# Patient Record
Sex: Male | Born: 1970 | Race: White | Hispanic: No | Marital: Single | Smoking: Current every day smoker
Health system: Southern US, Community
[De-identification: ages and names within clinical notes are randomized; demographics above are authoritative.]

---

## 2017-08-27 ENCOUNTER — Other Ambulatory Visit: Payer: Self-pay

## 2017-08-27 ENCOUNTER — Emergency Department (HOSPITAL_COMMUNITY): Payer: No Typology Code available for payment source

## 2017-08-27 ENCOUNTER — Encounter (HOSPITAL_COMMUNITY): Admission: EM | Disposition: A | Discharge status: Skilled Nursing Facility | Payer: Self-pay | Source: Home / Self Care

## 2017-08-27 ENCOUNTER — Encounter (HOSPITAL_COMMUNITY): Payer: Self-pay | Admitting: Surgery

## 2017-08-27 ENCOUNTER — Inpatient Hospital Stay (HOSPITAL_COMMUNITY)
Admission: EM | Admit: 2017-08-27 | Discharge: 2017-09-13 | DRG: 956 | Disposition: A | Discharge status: Skilled Nursing Facility | Payer: No Typology Code available for payment source | Attending: General Surgery | Admitting: General Surgery

## 2017-08-27 ENCOUNTER — Emergency Department (HOSPITAL_COMMUNITY): Payer: No Typology Code available for payment source | Admitting: Anesthesiology

## 2017-08-27 DIAGNOSIS — K5900 Constipation, unspecified: Secondary | ICD-10-CM | POA: Diagnosis not present

## 2017-08-27 DIAGNOSIS — S32592A Other specified fracture of left pubis, initial encounter for closed fracture: Secondary | ICD-10-CM | POA: Diagnosis present

## 2017-08-27 DIAGNOSIS — S01111A Laceration without foreign body of right eyelid and periocular area, initial encounter: Secondary | ICD-10-CM | POA: Diagnosis present

## 2017-08-27 DIAGNOSIS — S299XXA Unspecified injury of thorax, initial encounter: Secondary | ICD-10-CM

## 2017-08-27 DIAGNOSIS — R402353 Coma scale, best motor response, localizes pain, at hospital admission: Secondary | ICD-10-CM | POA: Diagnosis present

## 2017-08-27 DIAGNOSIS — S0181XA Laceration without foreign body of other part of head, initial encounter: Secondary | ICD-10-CM | POA: Diagnosis present

## 2017-08-27 DIAGNOSIS — S066X9A Traumatic subarachnoid hemorrhage with loss of consciousness of unspecified duration, initial encounter: Secondary | ICD-10-CM | POA: Diagnosis present

## 2017-08-27 DIAGNOSIS — S42202A Unspecified fracture of upper end of left humerus, initial encounter for closed fracture: Secondary | ICD-10-CM | POA: Diagnosis present

## 2017-08-27 DIAGNOSIS — Z419 Encounter for procedure for purposes other than remedying health state, unspecified: Secondary | ICD-10-CM

## 2017-08-27 DIAGNOSIS — R609 Edema, unspecified: Secondary | ICD-10-CM

## 2017-08-27 DIAGNOSIS — S3609XA Other injury of spleen, initial encounter: Secondary | ICD-10-CM | POA: Diagnosis present

## 2017-08-27 DIAGNOSIS — S0232XA Fracture of orbital floor, left side, initial encounter for closed fracture: Secondary | ICD-10-CM | POA: Diagnosis present

## 2017-08-27 DIAGNOSIS — F17210 Nicotine dependence, cigarettes, uncomplicated: Secondary | ICD-10-CM | POA: Diagnosis present

## 2017-08-27 DIAGNOSIS — R402133 Coma scale, eyes open, to sound, at hospital admission: Secondary | ICD-10-CM | POA: Diagnosis present

## 2017-08-27 DIAGNOSIS — S0633AA Contusion and laceration of cerebrum, unspecified, with loss of consciousness status unknown, initial encounter: Secondary | ICD-10-CM

## 2017-08-27 DIAGNOSIS — S022XXA Fracture of nasal bones, initial encounter for closed fracture: Secondary | ICD-10-CM | POA: Diagnosis present

## 2017-08-27 DIAGNOSIS — S0240FA Zygomatic fracture, left side, initial encounter for closed fracture: Secondary | ICD-10-CM | POA: Diagnosis present

## 2017-08-27 DIAGNOSIS — E870 Hyperosmolality and hypernatremia: Secondary | ICD-10-CM | POA: Diagnosis not present

## 2017-08-27 DIAGNOSIS — J9811 Atelectasis: Secondary | ICD-10-CM | POA: Diagnosis present

## 2017-08-27 DIAGNOSIS — T148XXA Other injury of unspecified body region, initial encounter: Secondary | ICD-10-CM

## 2017-08-27 DIAGNOSIS — I959 Hypotension, unspecified: Secondary | ICD-10-CM | POA: Diagnosis present

## 2017-08-27 DIAGNOSIS — S72142A Displaced intertrochanteric fracture of left femur, initial encounter for closed fracture: Secondary | ICD-10-CM

## 2017-08-27 DIAGNOSIS — S81811A Laceration without foreign body, right lower leg, initial encounter: Secondary | ICD-10-CM | POA: Diagnosis present

## 2017-08-27 DIAGNOSIS — K661 Hemoperitoneum: Secondary | ICD-10-CM | POA: Diagnosis present

## 2017-08-27 DIAGNOSIS — S06339A Contusion and laceration of cerebrum, unspecified, with loss of consciousness of unspecified duration, initial encounter: Secondary | ICD-10-CM

## 2017-08-27 DIAGNOSIS — S32602A Unspecified fracture of left ischium, initial encounter for closed fracture: Secondary | ICD-10-CM | POA: Diagnosis present

## 2017-08-27 DIAGNOSIS — R402243 Coma scale, best verbal response, confused conversation, at hospital admission: Secondary | ICD-10-CM | POA: Diagnosis present

## 2017-08-27 DIAGNOSIS — D62 Acute posthemorrhagic anemia: Secondary | ICD-10-CM | POA: Diagnosis present

## 2017-08-27 DIAGNOSIS — S7290XA Unspecified fracture of unspecified femur, initial encounter for closed fracture: Secondary | ICD-10-CM

## 2017-08-27 DIAGNOSIS — J9691 Respiratory failure, unspecified with hypoxia: Secondary | ICD-10-CM | POA: Diagnosis present

## 2017-08-27 DIAGNOSIS — J189 Pneumonia, unspecified organism: Secondary | ICD-10-CM

## 2017-08-27 DIAGNOSIS — T1490XA Injury, unspecified, initial encounter: Secondary | ICD-10-CM

## 2017-08-27 DIAGNOSIS — J9690 Respiratory failure, unspecified, unspecified whether with hypoxia or hypercapnia: Secondary | ICD-10-CM

## 2017-08-27 HISTORY — PX: LAPAROTOMY: SHX154

## 2017-08-27 HISTORY — PX: INSERTION OF TRACTION PIN: SHX6560

## 2017-08-27 HISTORY — PX: FACIAL LACERATION REPAIR: SHX6589

## 2017-08-27 LAB — COMPREHENSIVE METABOLIC PANEL
ALT: 38 U/L (ref 17–63)
AST: 76 U/L — ABNORMAL HIGH (ref 15–41)
Albumin: 3.2 g/dL — ABNORMAL LOW (ref 3.5–5.0)
Alkaline Phosphatase: 63 U/L (ref 38–126)
Anion gap: 13 (ref 5–15)
BUN: 11 mg/dL (ref 6–20)
CO2: 21 mmol/L — ABNORMAL LOW (ref 22–32)
Calcium: 8.2 mg/dL — ABNORMAL LOW (ref 8.9–10.3)
Chloride: 100 mmol/L — ABNORMAL LOW (ref 101–111)
Creatinine, Ser: 1.23 mg/dL (ref 0.61–1.24)
GFR calc Af Amer: >60 mL/min (ref >60)
GFR calc non Af Amer: >60 mL/min (ref >60)
Glucose, Bld: 146 mg/dL — ABNORMAL HIGH (ref 65–99)
Potassium: 3.8 mmol/L (ref 3.5–5.1)
Sodium: 134 mmol/L — ABNORMAL LOW (ref 135–145)
Total Bilirubin: 0.9 mg/dL (ref 0.3–1.2)
Total Protein: 6.2 g/dL — ABNORMAL LOW (ref 6.5–8.1)

## 2017-08-27 LAB — I-STAT CHEM 8, ED
BUN: 13 mg/dL (ref 6–20)
Calcium, Ion: 1.04 mmol/L — ABNORMAL LOW (ref 1.15–1.40)
Chloride: 99 mmol/L — ABNORMAL LOW (ref 101–111)
Creatinine, Ser: 1 mg/dL (ref 0.61–1.24)
Glucose, Bld: 147 mg/dL — ABNORMAL HIGH (ref 65–99)
HCT: 40 % (ref 39.0–52.0)
Hemoglobin: 13.6 g/dL (ref 13.0–17.0)
Potassium: 3.6 mmol/L (ref 3.5–5.1)
Sodium: 135 mmol/L (ref 135–145)
TCO2: 23 mmol/L (ref 22–32)

## 2017-08-27 LAB — I-STAT ARTERIAL BLOOD GAS, ED
Acid-base deficit: 3 mmol/L — ABNORMAL HIGH (ref 0.0–2.0)
Bicarbonate: 24.1 mmol/L (ref 20.0–28.0)
O2 Saturation: 100 %
TCO2: 26 mmol/L (ref 22–32)
pCO2 arterial: 50.7 mmHg — ABNORMAL HIGH (ref 32.0–48.0)
pH, Arterial: 7.286 — ABNORMAL LOW (ref 7.350–7.450)
pO2, Arterial: 291 mmHg — ABNORMAL HIGH (ref 83.0–108.0)

## 2017-08-27 LAB — CDS SEROLOGY

## 2017-08-27 LAB — PROTIME-INR
INR: 1.34
Prothrombin Time: 16.5 seconds — ABNORMAL HIGH (ref 11.4–15.2)

## 2017-08-27 LAB — CBC
HCT: 38.8 % — ABNORMAL LOW (ref 39.0–52.0)
Hemoglobin: 12.9 g/dL — ABNORMAL LOW (ref 13.0–17.0)
MCH: 31.2 pg (ref 26.0–34.0)
MCHC: 33.2 g/dL (ref 30.0–36.0)
MCV: 93.7 fL (ref 78.0–100.0)
Platelets: 244 10*3/uL (ref 150–400)
RBC: 4.14 MIL/uL — ABNORMAL LOW (ref 4.22–5.81)
RDW: 13 % (ref 11.5–15.5)
WBC: 17.9 10*3/uL — ABNORMAL HIGH (ref 4.0–10.5)

## 2017-08-27 LAB — I-STAT CG4 LACTIC ACID, ED: Lactic Acid, Venous: 7.16 mmol/L (ref 0.5–1.9)

## 2017-08-27 LAB — PREPARE RBC (CROSSMATCH)

## 2017-08-27 LAB — ETHANOL: Alcohol, Ethyl (B): <10 mg/dL (ref <10)

## 2017-08-27 LAB — ABO/RH: ABO/RH(D): A POS

## 2017-08-27 SURGERY — LAPAROTOMY, EXPLORATORY
Anesthesia: General | Site: Leg Lower | Laterality: Right

## 2017-08-27 MED ORDER — PROPOFOL 1000 MG/100ML IV EMUL
0.0000 ug/kg/min | INTRAVENOUS | Status: DC
  Administered 2017-08-27: 25 ug/kg/min via INTRAVENOUS
  Administered 2017-08-28: 15 ug/kg/min via INTRAVENOUS
  Administered 2017-08-28 (×2): 20 ug/kg/min via INTRAVENOUS
  Administered 2017-08-28: 30 ug/kg/min via INTRAVENOUS
  Administered 2017-08-29: 25 ug/kg/min via INTRAVENOUS
  Administered 2017-08-29: 35 ug/kg/min via INTRAVENOUS
  Administered 2017-08-29 – 2017-08-30 (×2): 30 ug/kg/min via INTRAVENOUS
  Administered 2017-08-30: 40 ug/kg/min via INTRAVENOUS
  Administered 2017-08-30 (×2): 25 ug/kg/min via INTRAVENOUS
  Administered 2017-08-30 – 2017-08-31 (×3): 30 ug/kg/min via INTRAVENOUS
  Filled 2017-08-27 (×14): qty 100

## 2017-08-27 MED ORDER — DOCUSATE SODIUM 50 MG/5ML PO LIQD
100.0000 mg | Freq: Two times a day (BID) | ORAL | Status: DC | PRN
  Administered 2017-08-29 – 2017-08-31 (×4): 100 mg
  Filled 2017-08-27 (×4): qty 10

## 2017-08-27 MED ORDER — SODIUM CHLORIDE 0.9 % IV SOLN
INTRAVENOUS | Status: DC
  Administered 2017-08-28 – 2017-08-31 (×6): via INTRAVENOUS

## 2017-08-27 MED ORDER — 0.9 % SODIUM CHLORIDE (POUR BTL) OPTIME
TOPICAL | Status: DC | PRN
  Administered 2017-08-27: 3000 mL

## 2017-08-27 MED ORDER — HYDRALAZINE HCL 20 MG/ML IJ SOLN: 10.0000 mg | INTRAMUSCULAR | Status: DC | PRN

## 2017-08-27 MED ORDER — MIDAZOLAM HCL 2 MG/2ML IJ SOLN
INTRAMUSCULAR | Status: AC
  Filled 2017-08-27: qty 2

## 2017-08-27 MED ORDER — ROCURONIUM BROMIDE 50 MG/5ML IV SOLN
INTRAVENOUS | Status: AC | PRN
  Administered 2017-08-27: 100 mg via INTRAVENOUS

## 2017-08-27 MED ORDER — FENTANYL CITRATE (PF) 100 MCG/2ML IJ SOLN: 50.0000 ug | Freq: Once | INTRAMUSCULAR | Status: DC

## 2017-08-27 MED ORDER — FENTANYL CITRATE (PF) 250 MCG/5ML IJ SOLN
INTRAMUSCULAR | Status: DC | PRN
  Administered 2017-08-27 (×2): 100 ug via INTRAVENOUS
  Administered 2017-08-27: 50 ug via INTRAVENOUS

## 2017-08-27 MED ORDER — TETANUS-DIPHTH-ACELL PERTUSSIS 5-2.5-18.5 LF-MCG/0.5 IM SUSP
0.5000 mL | Freq: Once | INTRAMUSCULAR | Status: AC
  Administered 2017-09-04: 0.5 mL via INTRAMUSCULAR
  Filled 2017-08-27: qty 0.5

## 2017-08-27 MED ORDER — MIDAZOLAM HCL 2 MG/2ML IJ SOLN
2.0000 mg | INTRAMUSCULAR | Status: DC | PRN
  Administered 2017-09-03: 2 mg via INTRAVENOUS
  Filled 2017-08-27: qty 2

## 2017-08-27 MED ORDER — PHENYLEPHRINE HCL 10 MG/ML IJ SOLN
INTRAMUSCULAR | Status: DC | PRN
  Administered 2017-08-27 (×2): 80 ug via INTRAVENOUS

## 2017-08-27 MED ORDER — MIDAZOLAM HCL 2 MG/2ML IJ SOLN
INTRAMUSCULAR | Status: DC | PRN
  Administered 2017-08-27: 2 mg via INTRAVENOUS

## 2017-08-27 MED ORDER — ETOMIDATE 2 MG/ML IV SOLN
INTRAVENOUS | Status: AC | PRN
  Administered 2017-08-27: 30 mg via INTRAVENOUS

## 2017-08-27 MED ORDER — PROPOFOL 1000 MG/100ML IV EMUL
INTRAVENOUS | Status: AC | PRN
  Administered 2017-08-27: 10 ug/kg/min via INTRAVENOUS

## 2017-08-27 MED ORDER — PROPOFOL 10 MG/ML IV BOLUS
INTRAVENOUS | Status: AC
  Filled 2017-08-27: qty 20

## 2017-08-27 MED ORDER — FENTANYL CITRATE (PF) 100 MCG/2ML IJ SOLN
INTRAMUSCULAR | Status: AC | PRN
  Administered 2017-08-27: 100 ug via INTRAVENOUS

## 2017-08-27 MED ORDER — FENTANYL CITRATE (PF) 250 MCG/5ML IJ SOLN
INTRAMUSCULAR | Status: AC
  Filled 2017-08-27: qty 5

## 2017-08-27 MED ORDER — PROPOFOL 500 MG/50ML IV EMUL
INTRAVENOUS | Status: DC | PRN
  Administered 2017-08-27: 25 ug/kg/min via INTRAVENOUS

## 2017-08-27 MED ORDER — ROCURONIUM BROMIDE 100 MG/10ML IV SOLN
INTRAVENOUS | Status: DC | PRN
  Administered 2017-08-27 (×2): 50 mg via INTRAVENOUS

## 2017-08-27 MED ORDER — ROCURONIUM BROMIDE 50 MG/5ML IV SOLN
INTRAVENOUS | Status: AC
  Filled 2017-08-27: qty 2

## 2017-08-27 MED ORDER — CEFAZOLIN SODIUM-DEXTROSE 2-3 GM-%(50ML) IV SOLR
INTRAVENOUS | Status: DC | PRN
  Administered 2017-08-27: 2 g via INTRAVENOUS

## 2017-08-27 MED ORDER — CEFAZOLIN SODIUM 1 G IJ SOLR
INTRAMUSCULAR | Status: AC
  Filled 2017-08-27: qty 20

## 2017-08-27 MED ORDER — FENTANYL 2500MCG IN NS 250ML (10MCG/ML) PREMIX INFUSION
25.0000 ug/h | INTRAVENOUS | Status: DC
  Administered 2017-08-27: 50 ug/h via INTRAVENOUS
  Administered 2017-08-28: 150 ug/h via INTRAVENOUS
  Administered 2017-08-28: 250 ug/h via INTRAVENOUS
  Administered 2017-08-29: 150 ug/h via INTRAVENOUS
  Administered 2017-08-29 – 2017-08-30 (×2): 250 ug/h via INTRAVENOUS
  Administered 2017-08-30: 200 ug/h via INTRAVENOUS
  Administered 2017-08-31: 250 ug/h via INTRAVENOUS
  Administered 2017-08-31: 100 ug/h via INTRAVENOUS
  Administered 2017-09-01: 50 ug/h via INTRAVENOUS
  Filled 2017-08-27 (×10): qty 250

## 2017-08-27 MED ORDER — ORAL CARE MOUTH RINSE
15.0000 mL | Freq: Four times a day (QID) | OROMUCOSAL | Status: DC
  Administered 2017-08-28: 15 mL via OROMUCOSAL

## 2017-08-27 MED ORDER — CHLORHEXIDINE GLUCONATE 0.12% ORAL RINSE (MEDLINE KIT)
15.0000 mL | Freq: Two times a day (BID) | OROMUCOSAL | Status: DC
  Administered 2017-08-28: 15 mL via OROMUCOSAL

## 2017-08-27 MED ORDER — FENTANYL BOLUS VIA INFUSION
50.0000 ug | INTRAVENOUS | Status: DC | PRN
  Administered 2017-08-28 – 2017-09-01 (×4): 50 ug via INTRAVENOUS
  Filled 2017-08-27: qty 50

## 2017-08-27 MED ORDER — LACTATED RINGERS IV SOLN
INTRAVENOUS | Status: DC | PRN
  Administered 2017-08-27: 21:00:00 via INTRAVENOUS

## 2017-08-27 MED ORDER — SODIUM CHLORIDE 0.9 % IV SOLN
INTRAVENOUS | Status: DC | PRN
  Administered 2017-08-27: 21:00:00 via INTRAVENOUS

## 2017-08-27 MED ORDER — SODIUM CHLORIDE 0.9 % IV SOLN
Freq: Once | INTRAVENOUS | Status: AC
  Administered 2017-08-27: via INTRAVENOUS

## 2017-08-27 MED ORDER — IOPAMIDOL (ISOVUE-300) INJECTION 61%
100.0000 mL | Freq: Once | INTRAVENOUS | Status: AC | PRN
  Administered 2017-08-27: 100 mL via INTRAVENOUS

## 2017-08-27 MED ORDER — FAMOTIDINE IN NACL 20-0.9 MG/50ML-% IV SOLN
20.0000 mg | Freq: Two times a day (BID) | INTRAVENOUS | Status: DC
  Administered 2017-08-28 – 2017-09-04 (×15): 20 mg via INTRAVENOUS
  Filled 2017-08-27 (×15): qty 50

## 2017-08-27 MED ORDER — MIDAZOLAM HCL 2 MG/2ML IJ SOLN
2.0000 mg | INTRAMUSCULAR | Status: DC | PRN
  Administered 2017-08-29 – 2017-09-04 (×8): 2 mg via INTRAVENOUS
  Filled 2017-08-27 (×9): qty 2

## 2017-08-27 MED ORDER — PROPOFOL 1000 MG/100ML IV EMUL
INTRAVENOUS | Status: AC
  Filled 2017-08-27: qty 100

## 2017-08-27 SURGICAL SUPPLY — 50 items
BLADE CLIPPER SURG (BLADE) IMPLANT
BNDG GAUZE ELAST 4 BULKY (GAUZE/BANDAGES/DRESSINGS) ×4 IMPLANT
CANISTER SUCT 3000ML PPV (MISCELLANEOUS) ×8 IMPLANT
CANISTER WOUNDNEG PRESSURE 500 (CANNISTER) ×8 IMPLANT
CHLORAPREP W/TINT 26ML (MISCELLANEOUS) IMPLANT
COVER SURGICAL LIGHT HANDLE (MISCELLANEOUS) ×4 IMPLANT
DRAPE LAPAROSCOPIC ABDOMINAL (DRAPES) ×4 IMPLANT
DRAPE WARM FLUID 44X44 (DRAPE) ×4 IMPLANT
DRSG OPSITE POSTOP 4X10 (GAUZE/BANDAGES/DRESSINGS) IMPLANT
DRSG OPSITE POSTOP 4X8 (GAUZE/BANDAGES/DRESSINGS) IMPLANT
DURAPREP 26ML APPLICATOR (WOUND CARE) ×4 IMPLANT
ELECT BLADE 6.5 EXT (BLADE) ×4 IMPLANT
ELECT CAUTERY BLADE 6.4 (BLADE) ×4 IMPLANT
ELECT REM PT RETURN 9FT ADLT (ELECTROSURGICAL) ×4
ELECTRODE REM PT RTRN 9FT ADLT (ELECTROSURGICAL) ×3 IMPLANT
GAUZE SPONGE 4X4 16PLY XRAY LF (GAUZE/BANDAGES/DRESSINGS) ×4 IMPLANT
GLOVE BIO SURGEON STRL SZ7 (GLOVE) ×4 IMPLANT
GLOVE BIOGEL PI IND STRL 7.0 (GLOVE) ×9 IMPLANT
GLOVE BIOGEL PI IND STRL 7.5 (GLOVE) ×3 IMPLANT
GLOVE BIOGEL PI INDICATOR 7.0 (GLOVE) ×3
GLOVE BIOGEL PI INDICATOR 7.5 (GLOVE) ×1
GOWN STRL REUS W/ TWL LRG LVL3 (GOWN DISPOSABLE) ×6 IMPLANT
GOWN STRL REUS W/TWL LRG LVL3 (GOWN DISPOSABLE) ×2
K-WIRE SMOOTH TROCAR 2.0X150 (WIRE) ×4
KIT BASIN OR (CUSTOM PROCEDURE TRAY) ×4 IMPLANT
KIT TURNOVER KIT B (KITS) ×4 IMPLANT
KWIRE SMOOTH TROCAR 2.0X150 (WIRE) ×3 IMPLANT
LIGASURE IMPACT 36 18CM CVD LR (INSTRUMENTS) ×4 IMPLANT
NS IRRIG 1000ML POUR BTL (IV SOLUTION) ×12 IMPLANT
PACK GENERAL/GYN (CUSTOM PROCEDURE TRAY) ×4 IMPLANT
PAD ARMBOARD 7.5X6 YLW CONV (MISCELLANEOUS) ×4 IMPLANT
SCRUB BETADINE 4OZ XXX (MISCELLANEOUS) ×4 IMPLANT
SOLUTION BETADINE 4OZ (MISCELLANEOUS) ×4 IMPLANT
SPECIMEN JAR LARGE (MISCELLANEOUS) ×4 IMPLANT
SPONGE LAP 18X18 X RAY DECT (DISPOSABLE) ×28 IMPLANT
STAPLER VISISTAT 35W (STAPLE) IMPLANT
SUCTION POOLE TIP (SUCTIONS) ×4 IMPLANT
SUT PDS AB 1 TP1 96 (SUTURE) ×12 IMPLANT
SUT SILK 2 0 (SUTURE) ×1
SUT SILK 2 0 SH CR/8 (SUTURE) ×4 IMPLANT
SUT SILK 2-0 18XBRD TIE 12 (SUTURE) ×3 IMPLANT
SUT SILK 3 0 (SUTURE) ×1
SUT SILK 3 0 SH CR/8 (SUTURE) ×4 IMPLANT
SUT SILK 3-0 18XBRD TIE 12 (SUTURE) ×3 IMPLANT
SUT VIC AB 3-0 SH 27 (SUTURE)
SUT VIC AB 3-0 SH 27X BRD (SUTURE) IMPLANT
TOWEL GREEN STERILE FF (TOWEL DISPOSABLE) ×4 IMPLANT
TOWEL OR 17X26 10 PK STRL BLUE (TOWEL DISPOSABLE) ×4 IMPLANT
TRAY FOLEY MTR SLVR 16FR STAT (SET/KITS/TRAYS/PACK) ×4 IMPLANT
YANKAUER SUCT BULB TIP NO VENT (SUCTIONS) ×4 IMPLANT
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
## 2017-08-27 NOTE — Progress Notes (Signed)
Orthopedic Tech Progress Note Patient Details:  Alan Maddox 1970/09/27 [id number]  Musculoskeletal Traction Type of Traction: Skeletal (Balanced Suspension) Traction Location: lle Traction Weight: 20 lbs   Post Interventions Patient Tolerated: Well Instructions Provided: Care of device, Adjustment of device Applied at drs request.  Trinna Post 08/27/2017, 11:27 PM

## 2017-08-27 NOTE — Progress Notes (Signed)
Orthopedic Tech Progress Note Patient Details:  Alan Maddox 04/25/1876 [id number]  Patient ID: Alan Maddox, male   DOB: 04/25/1876, 47 y.o.   MRN: [id number]   Saul Fordyce 08/27/2017, 6:38 PMTrauma

## 2017-08-27 NOTE — Anesthesia Preprocedure Evaluation (Signed)
Anesthesia Evaluation  Patient identified by MRN, date of birth, ID band  Reviewed: Unable to perform ROS - Chart review onlyPreop documentation limited or incomplete due to emergent nature of procedure.  Airway Mallampati: Intubated       Dental   Pulmonary    breath sounds clear to auscultation       Cardiovascular  Rhythm:Regular Rate:Tachycardia     Neuro/Psych    GI/Hepatic   Endo/Other    Renal/GU      Musculoskeletal   Abdominal   Peds  Hematology   Anesthesia Other Findings   Reproductive/Obstetrics                             Anesthesia Physical Anesthesia Plan  ASA: IV and emergent  Anesthesia Plan: General   Post-op Pain Management:    Induction: Inhalational  PONV Risk Score and Plan: 2 and Ondansetron, Midazolam and Treatment may vary due to age or medical condition  Airway Management Planned: Oral ETT  Additional Equipment: Arterial line  Intra-op Plan:   Post-operative Plan: Post-operative intubation/ventilation  Informed Consent: I have reviewed the patients History and Physical, chart, labs and discussed the procedure including the risks, benefits and alternatives for the proposed anesthesia with the patient or authorized representative who has indicated his/her understanding and acceptance.   Only emergency history available and History available from chart only  Plan Discussed with: CRNA  Anesthesia Plan Comments:         Anesthesia Quick Evaluation
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
## 2017-08-27 NOTE — Brief Op Note (Signed)
08/27/2017  9:42 PM  PATIENT:  Alan Maddox  47 y.o. male  PRE-OPERATIVE DIAGNOSIS:  Ruptured Spleen  POST-OPERATIVE DIAGNOSIS:  * No post-op diagnosis entered *  PROCEDURE:  Procedure(s): EXPLORATORY LAPAROTOMY, SPLENECTOMY (N/A)  SURGEON:  Surgeon(s) and Role:    * Emelia Loron, MD - Primary    * Darnell Level, MD - Assisting  PHYSICIAN ASSISTANT:   ASSISTANTS: Dr. Gerrit Friends   ANESTHESIA:   general  EBL: 500 cc upon entering  BLOOD ADMINISTERED:2 u ffp, 2 u prbc, plts  DRAINS: none   LOCAL MEDICATIONS USED:  NONE  SPECIMEN:  spleen  DISPOSITION OF SPECIMEN:  PATHOLOGY  COUNTS:  YES  TOURNIQUET:  * No tourniquets in log *  DICTATION: .Dragon Dictation  PLAN OF CARE: Admit to inpatient   PATIENT DISPOSITION:  ICU - intubated and critically ill.   Delay start of Pharmacological VTE agent (>24hrs) due to surgical blood loss or risk of bleeding: yes
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
## 2017-08-27 NOTE — Progress Notes (Signed)
Pt transported to and from CT without event.  Rt will continue to monitor. 

## 2017-08-27 NOTE — Progress Notes (Signed)
RT NOTE:  ABG drawn from [facility]. ISTAT will not cross results over to patients chart.   PH:  7.25 PCO2: 56.8 PO2:  147 BE:  -2 HCO3: 25.4 TCO2: 27 SO2:  99%  ABG drawn on [facility]: VT: 600, RR: 18, FIO2: 100%, PEEP: 5   Vent changes following ABG:  VT:  660 RR:  20 FIO2:  70%

## 2017-08-27 NOTE — ED Notes (Signed)
Pt to CT at this time.
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
## 2017-08-27 NOTE — Op Note (Signed)
PATIENT:  Alan Maddox  47 y.o. male  PRE-OPERATIVE DIAGNOSIS: Right Eyebrow Laceration 6 cm  POST-OPERATIVE DIAGNOSIS:  Right Eyebrow Laceration 6 cm  PROCEDURE:  Procedure(s): Repair of Right Eyebrow Laceration and Exam Under Anesthesia  SURGEON:  Surgeon(s) and Role:    Christopher T. Sherryll Burger, MD - primary  ANESTHESIA:   general  Op Note: After Dr. Dwain Sarna and Dr. Gerrit Friends finished their portion of the surgery, the patient was prepped w/ betadine and draped with sterile towels. The eyebrow laceration was explored and found to be down to the periosteum. A layered closure was performed w/ 5-0 vicryl suture deep layer and then skin closed with 5-0 plain gut in an interrupted followed by running w/ locking sutures. Patient was cleaned off and left in the care of anesthesia pending disposition to ICU.   PLAN OF CARE: Admit to inpatient   PATIENT DISPOSITION:  ICU - intubated and critically ill.

## 2017-08-27 NOTE — Progress Notes (Signed)
Pt was urgently manually ventilated 100% via ETT to the OR. No complications noted. Uneventful trip.
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
## 2017-08-27 NOTE — Anesthesia Postprocedure Evaluation (Signed)
Anesthesia Post Note  Patient: Cali Cuartas  Procedure(s) Performed: EXPLORATORY LAPAROTOMY, SPLENECTOMY (N/A Abdomen) INSERTION OF TRACTION PIN (Left Leg Lower) FACIAL LACERATION REPAIR, RIGHT EYEBROW (Right Eye)     Patient location during evaluation: SICU Anesthesia Type: General Level of consciousness: sedated Pain management: pain level controlled Vital Signs Assessment: post-procedure vital signs reviewed and stable Respiratory status: patient remains intubated per anesthesia plan Cardiovascular status: stable Postop Assessment: no apparent nausea or vomiting Anesthetic complications: no    Last Vitals:  Vitals:   08/27/17 2030 08/27/17 2252  BP: (!) 129/113 136/79  Pulse: (!) 105 89  Resp: 17 16  Temp:    SpO2: 100% 100%                  Shelton Silvas

## 2017-08-27 NOTE — Transfer of Care (Signed)
Immediate Anesthesia Transfer of Care Note  Patient: Alan Maddox  Procedure(s) Performed: EXPLORATORY LAPAROTOMY, SPLENECTOMY (N/A Abdomen) INSERTION OF TRACTION PIN (Left Leg Lower) FACIAL LACERATION REPAIR, RIGHT EYEBROW (Right Eye)  Patient Location: ICU  Anesthesia Type:General  Level of Consciousness: sedated and Patient remains intubated per anesthesia plan  Airway & Oxygen Therapy: Patient remains intubated per anesthesia plan and Patient placed on Ventilator (see vital sign flow sheet for setting)  Post-op Assessment: Report given to RN and Post -op Vital signs reviewed and stable  Post vital signs: Reviewed and stable  Last Vitals:  Vitals Value Taken Time  BP    Temp    Pulse    Resp    SpO2      Last Pain:  Vitals:   08/27/17 1825  TempSrc:   PainSc: 9          Complications: No apparent anesthesia complications

## 2017-08-27 NOTE — Anesthesia Procedure Notes (Signed)
Arterial Line Insertion Start/End5/08/2017 8:51 PM, 08/27/2017 8:22 PM Performed by: Shelton Silvas, MD, Jeani Hawking, CRNA, CRNA  Patient location: OR. Preanesthetic checklist: patient identified Patient sedated Left, radial was placed Catheter size: 20 G Hand hygiene performed  and maximum sterile barriers used   Attempts: 1 Procedure performed without using ultrasound guided technique. Following insertion, Biopatch and dressing applied. Post procedure assessment: unchanged  Patient tolerated the procedure well with no immediate complications.

## 2017-08-28 ENCOUNTER — Inpatient Hospital Stay (HOSPITAL_COMMUNITY): Payer: No Typology Code available for payment source

## 2017-08-28 ENCOUNTER — Encounter (HOSPITAL_COMMUNITY): Payer: Self-pay | Admitting: General Surgery

## 2017-08-28 LAB — BLOOD GAS, ARTERIAL
ACID-BASE DEFICIT: 0.9 mmol/L (ref 0.0–2.0)
BICARBONATE: 23.6 mmol/L (ref 20.0–28.0)
DRAWN BY: 519031
FIO2: 40
LHR: 20 {breaths}/min
MECHVT: 660 mL
O2 SAT: 95.6 %
PEEP/CPAP: 5 cmH2O
PH ART: 7.372 (ref 7.350–7.450)
Patient temperature: 98.6
pCO2 arterial: 41.6 mmHg (ref 32.0–48.0)
pO2, Arterial: 74.6 mmHg — ABNORMAL LOW (ref 83.0–108.0)

## 2017-08-28 LAB — BASIC METABOLIC PANEL
Anion gap: 5 (ref 5–15)
BUN: 12 mg/dL (ref 6–20)
CALCIUM: 7 mg/dL — AB (ref 8.9–10.3)
CO2: 24 mmol/L (ref 22–32)
CREATININE: 0.91 mg/dL (ref 0.61–1.24)
Chloride: 107 mmol/L (ref 101–111)
GFR calc non Af Amer: >60 mL/min (ref >60)
Glucose, Bld: 199 mg/dL — ABNORMAL HIGH (ref 65–99)
Potassium: 4.3 mmol/L (ref 3.5–5.1)
SODIUM: 136 mmol/L (ref 135–145)

## 2017-08-28 LAB — URINALYSIS, ROUTINE W REFLEX MICROSCOPIC
Bilirubin Urine: NEGATIVE
Glucose, UA: NEGATIVE mg/dL
Ketones, ur: NEGATIVE mg/dL
Leukocytes, UA: NEGATIVE
Nitrite: NEGATIVE
PROTEIN: 30 mg/dL — AB
Specific Gravity, Urine: 1.044 — ABNORMAL HIGH (ref 1.005–1.030)
pH: 5 (ref 5.0–8.0)

## 2017-08-28 LAB — POCT I-STAT 7, (LYTES, BLD GAS, ICA,H+H)
Acid-base deficit: 1 mmol/L (ref 0.0–2.0)
Bicarbonate: 26.5 mmol/L (ref 20.0–28.0)
Calcium, Ion: 1.07 mmol/L — ABNORMAL LOW (ref 1.15–1.40)
HCT: 22 % — ABNORMAL LOW (ref 39.0–52.0)
Hemoglobin: 7.5 g/dL — ABNORMAL LOW (ref 13.0–17.0)
O2 SAT: 100 %
PCO2 ART: 55.5 mmHg — AB (ref 32.0–48.0)
PO2 ART: 217 mmHg — AB (ref 83.0–108.0)
Patient temperature: 35.6
Potassium: 4.3 mmol/L (ref 3.5–5.1)
Sodium: 139 mmol/L (ref 135–145)
TCO2: 28 mmol/L (ref 22–32)
pH, Arterial: 7.28 — ABNORMAL LOW (ref 7.350–7.450)

## 2017-08-28 LAB — CBC
HCT: 28.9 % — ABNORMAL LOW (ref 39.0–52.0)
HEMATOCRIT: 31.6 % — AB (ref 39.0–52.0)
Hemoglobin: 10 g/dL — ABNORMAL LOW (ref 13.0–17.0)
Hemoglobin: 11 g/dL — ABNORMAL LOW (ref 13.0–17.0)
MCH: 31.1 pg (ref 26.0–34.0)
MCH: 30.7 pg (ref 26.0–34.0)
MCHC: 34.8 g/dL (ref 30.0–36.0)
MCHC: 34.6 g/dL (ref 30.0–36.0)
MCV: 89.8 fL (ref 78.0–100.0)
MCV: 88.3 fL (ref 78.0–100.0)
PLATELETS: 161 10*3/uL (ref 150–400)
Platelets: 140 10*3/uL — ABNORMAL LOW (ref 150–400)
RBC: 3.22 MIL/uL — ABNORMAL LOW (ref 4.22–5.81)
RBC: 3.58 MIL/uL — ABNORMAL LOW (ref 4.22–5.81)
RDW: 14.4 % (ref 11.5–15.5)
RDW: 14.1 % (ref 11.5–15.5)
WBC: 11.3 10*3/uL — ABNORMAL HIGH (ref 4.0–10.5)
WBC: 12.9 10*3/uL — AB (ref 4.0–10.5)

## 2017-08-28 LAB — PROTIME-INR
INR: 1.43
PROTHROMBIN TIME: 17.4 s — AB (ref 11.4–15.2)

## 2017-08-28 LAB — TRIGLYCERIDES: Triglycerides: 65 mg/dL (ref <150)

## 2017-08-28 LAB — POCT I-STAT 3, ART BLOOD GAS (G3+)
Acid-base deficit: 2 mmol/L (ref 0.0–2.0)
Bicarbonate: 25.4 mmol/L (ref 20.0–28.0)
O2 SAT: 99 %
PCO2 ART: 56.5 mmHg — AB (ref 32.0–48.0)
PO2 ART: 146 mmHg — AB (ref 83.0–108.0)
Patient temperature: 98.4
TCO2: 27 mmol/L (ref 22–32)
pH, Arterial: 7.259 — ABNORMAL LOW (ref 7.350–7.450)

## 2017-08-28 LAB — PREPARE PLATELET PHERESIS: Unit division: 0

## 2017-08-28 LAB — BPAM PLATELET PHERESIS
BLOOD PRODUCT EXPIRATION DATE: 201905062359
ISSUE DATE / TIME: 201905052124
Unit Type and Rh: 7300

## 2017-08-28 LAB — BLOOD PRODUCT ORDER (VERBAL) VERIFICATION

## 2017-08-28 LAB — LACTIC ACID, PLASMA: Lactic Acid, Venous: 1.8 mmol/L (ref 0.5–1.9)

## 2017-08-28 LAB — MRSA PCR SCREENING: MRSA by PCR: NEGATIVE

## 2017-08-28 LAB — HEMOGLOBIN AND HEMATOCRIT, BLOOD
HCT: 31.2 % — ABNORMAL LOW (ref 39.0–52.0)
Hemoglobin: 10.6 g/dL — ABNORMAL LOW (ref 13.0–17.0)

## 2017-08-28 LAB — GLUCOSE, CAPILLARY: GLUCOSE-CAPILLARY: 166 mg/dL — AB (ref 65–99)

## 2017-08-28 LAB — MASSIVE TRANSFUSION PROTOCOL ORDER (BLOOD BANK NOTIFICATION)

## 2017-08-28 MED ORDER — CHLORHEXIDINE GLUCONATE 0.12% ORAL RINSE (MEDLINE KIT)
15.0000 mL | Freq: Two times a day (BID) | OROMUCOSAL | Status: DC
  Administered 2017-08-28 – 2017-09-02 (×11): 15 mL via OROMUCOSAL

## 2017-08-28 MED ORDER — ORAL CARE MOUTH RINSE
15.0000 mL | OROMUCOSAL | Status: DC
  Administered 2017-08-28 – 2017-09-02 (×55): 15 mL via OROMUCOSAL

## 2017-08-28 MED ORDER — BACITRACIN-POLYMYXIN B 500-10000 UNIT/GM OP OINT
TOPICAL_OINTMENT | Freq: Two times a day (BID) | OPHTHALMIC | Status: AC
  Administered 2017-08-28 – 2017-08-29 (×3): via OPHTHALMIC
  Administered 2017-08-29 – 2017-08-30 (×2): 1 via OPHTHALMIC
  Administered 2017-08-30 – 2017-08-31 (×2): via OPHTHALMIC
  Filled 2017-08-28: qty 3.5

## 2017-08-28 NOTE — Progress Notes (Signed)
Patient transported to CT scan and back to 4N26 without incidence.
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
## 2017-08-29 ENCOUNTER — Inpatient Hospital Stay (HOSPITAL_COMMUNITY): Payer: No Typology Code available for payment source

## 2017-08-29 ENCOUNTER — Inpatient Hospital Stay (HOSPITAL_COMMUNITY): Payer: No Typology Code available for payment source | Admitting: Anesthesiology

## 2017-08-29 ENCOUNTER — Encounter (HOSPITAL_COMMUNITY): Admission: EM | Disposition: A | Discharge status: Skilled Nursing Facility | Payer: Self-pay | Source: Home / Self Care

## 2017-08-29 ENCOUNTER — Encounter (HOSPITAL_COMMUNITY): Payer: Self-pay | Admitting: Anesthesiology

## 2017-08-29 HISTORY — PX: INTRAMEDULLARY (IM) NAIL INTERTROCHANTERIC: SHX5875

## 2017-08-29 LAB — BASIC METABOLIC PANEL
Anion gap: 7 (ref 5–15)
BUN: 17 mg/dL (ref 6–20)
CALCIUM: 7.4 mg/dL — AB (ref 8.9–10.3)
CO2: 24 mmol/L (ref 22–32)
CREATININE: 0.87 mg/dL (ref 0.61–1.24)
Chloride: 108 mmol/L (ref 101–111)
GFR calc non Af Amer: >60 mL/min (ref >60)
Glucose, Bld: 114 mg/dL — ABNORMAL HIGH (ref 65–99)
Potassium: 4.3 mmol/L (ref 3.5–5.1)
SODIUM: 139 mmol/L (ref 135–145)

## 2017-08-29 LAB — BPAM FFP
Blood Product Expiration Date: 201905102359
Blood Product Expiration Date: 201905102359
Blood Product Expiration Date: 201905102359
Blood Product Expiration Date: 201905102359
Blood Product Expiration Date: 201905102359
Blood Product Expiration Date: 201905102359
Blood Product Expiration Date: 201905252359
Blood Product Expiration Date: 201905252359
ISSUE DATE / TIME: 201905051844
ISSUE DATE / TIME: 201905051844
ISSUE DATE / TIME: 201905052024
ISSUE DATE / TIME: 201905052024
ISSUE DATE / TIME: 201905052119
ISSUE DATE / TIME: 201905052119
ISSUE DATE / TIME: 201905052119
ISSUE DATE / TIME: 201905052119
Unit Type and Rh: 6200
Unit Type and Rh: 6200
Unit Type and Rh: 6200
Unit Type and Rh: 6200
Unit Type and Rh: 6200
Unit Type and Rh: 6200
Unit Type and Rh: 6200
Unit Type and Rh: 6200

## 2017-08-29 LAB — POCT I-STAT 4, (NA,K, GLUC, HGB,HCT)
Glucose, Bld: 126 mg/dL — ABNORMAL HIGH (ref 65–99)
Glucose, Bld: 127 mg/dL — ABNORMAL HIGH (ref 65–99)
HCT: 24 % — ABNORMAL LOW (ref 39.0–52.0)
HCT: 32 % — ABNORMAL LOW (ref 39.0–52.0)
Hemoglobin: 8.2 g/dL — ABNORMAL LOW (ref 13.0–17.0)
Hemoglobin: 10.9 g/dL — ABNORMAL LOW (ref 13.0–17.0)
Potassium: 4.4 mmol/L (ref 3.5–5.1)
Potassium: 4.7 mmol/L (ref 3.5–5.1)
Sodium: 140 mmol/L (ref 135–145)
Sodium: 169 mmol/L (ref 135–145)

## 2017-08-29 LAB — PREPARE FRESH FROZEN PLASMA
Unit division: 0
Unit division: 0
Unit division: 0

## 2017-08-29 LAB — POCT I-STAT 3, ART BLOOD GAS (G3+)
Acid-base deficit: 1 mmol/L (ref 0.0–2.0)
BICARBONATE: 24 mmol/L (ref 20.0–28.0)
O2 Saturation: 98 %
PCO2 ART: 41 mmHg (ref 32.0–48.0)
Patient temperature: 98.6
TCO2: 25 mmol/L (ref 22–32)
pH, Arterial: 7.376 (ref 7.350–7.450)
pO2, Arterial: 104 mmHg (ref 83.0–108.0)

## 2017-08-29 LAB — SURGICAL PCR SCREEN
MRSA, PCR: NEGATIVE
Staphylococcus aureus: NEGATIVE

## 2017-08-29 LAB — CBC
HEMATOCRIT: 27.5 % — AB (ref 39.0–52.0)
Hemoglobin: 9.4 g/dL — ABNORMAL LOW (ref 13.0–17.0)
MCH: 30.5 pg (ref 26.0–34.0)
MCHC: 34.2 g/dL (ref 30.0–36.0)
MCV: 89.3 fL (ref 78.0–100.0)
Platelets: 179 10*3/uL (ref 150–400)
RBC: 3.08 MIL/uL — ABNORMAL LOW (ref 4.22–5.81)
RDW: 14.4 % (ref 11.5–15.5)
WBC: 16.1 10*3/uL — ABNORMAL HIGH (ref 4.0–10.5)

## 2017-08-29 LAB — HEMOGLOBIN AND HEMATOCRIT, BLOOD
HCT: 28.9 % — ABNORMAL LOW (ref 39.0–52.0)
Hemoglobin: 9.8 g/dL — ABNORMAL LOW (ref 13.0–17.0)

## 2017-08-29 LAB — PROTIME-INR
INR: 1.17
Prothrombin Time: 14.9 seconds (ref 11.4–15.2)

## 2017-08-29 LAB — PREPARE RBC (CROSSMATCH)

## 2017-08-29 SURGERY — FIXATION, FRACTURE, INTERTROCHANTERIC, WITH INTRAMEDULLARY ROD
Anesthesia: General | Site: Hip | Laterality: Left

## 2017-08-29 MED ORDER — CEFAZOLIN SODIUM-DEXTROSE 2-3 GM-%(50ML) IV SOLR
INTRAVENOUS | Status: DC | PRN
  Administered 2017-08-29: 2 g via INTRAVENOUS

## 2017-08-29 MED ORDER — LACTATED RINGERS IV SOLN
INTRAVENOUS | Status: DC | PRN
  Administered 2017-08-29 (×2): via INTRAVENOUS

## 2017-08-29 MED ORDER — ROCURONIUM BROMIDE 50 MG/5ML IV SOLN
INTRAVENOUS | Status: AC
  Filled 2017-08-29: qty 1

## 2017-08-29 MED ORDER — CEFAZOLIN SODIUM-DEXTROSE 2-4 GM/100ML-% IV SOLN
2.0000 g | Freq: Three times a day (TID) | INTRAVENOUS | Status: AC
  Administered 2017-08-29 – 2017-08-30 (×3): 2 g via INTRAVENOUS
  Filled 2017-08-29 (×3): qty 100

## 2017-08-29 MED ORDER — PHENYLEPHRINE HCL 10 MG/ML IJ SOLN
INTRAVENOUS | Status: DC | PRN
  Administered 2017-08-29: 20 ug/min via INTRAVENOUS

## 2017-08-29 MED ORDER — FENTANYL CITRATE (PF) 250 MCG/5ML IJ SOLN
INTRAMUSCULAR | Status: AC
  Filled 2017-08-29: qty 5

## 2017-08-29 MED ORDER — FENTANYL CITRATE (PF) 250 MCG/5ML IJ SOLN
INTRAMUSCULAR | Status: DC | PRN
  Administered 2017-08-29: 100 ug via INTRAVENOUS
  Administered 2017-08-29: 150 ug via INTRAVENOUS
  Administered 2017-08-29: 100 ug via INTRAVENOUS
  Administered 2017-08-29: 50 ug via INTRAVENOUS
  Administered 2017-08-29: 100 ug via INTRAVENOUS
  Administered 2017-08-29: 50 ug via INTRAVENOUS
  Administered 2017-08-29: 150 ug via INTRAVENOUS
  Administered 2017-08-29: 50 ug via INTRAVENOUS

## 2017-08-29 MED ORDER — SODIUM CHLORIDE 0.9 % IV SOLN: Freq: Once | INTRAVENOUS | Status: DC

## 2017-08-29 MED ORDER — MIDAZOLAM HCL 2 MG/2ML IJ SOLN
INTRAMUSCULAR | Status: AC
  Filled 2017-08-29: qty 4

## 2017-08-29 MED ORDER — SODIUM CHLORIDE 0.9 % IV SOLN
INTRAVENOUS | Status: DC | PRN
  Administered 2017-08-29: 12:00:00 via INTRAVENOUS

## 2017-08-29 MED ORDER — 0.9 % SODIUM CHLORIDE (POUR BTL) OPTIME
TOPICAL | Status: DC | PRN
  Administered 2017-08-29: 1000 mL

## 2017-08-29 MED ORDER — ALBUMIN HUMAN 5 % IV SOLN
INTRAVENOUS | Status: DC | PRN
  Administered 2017-08-29 (×2): via INTRAVENOUS

## 2017-08-29 MED ORDER — CEFAZOLIN SODIUM-DEXTROSE 1-4 GM/50ML-% IV SOLN: 1.0000 g | INTRAVENOUS | Status: AC

## 2017-08-29 MED ORDER — ROCURONIUM BROMIDE 10 MG/ML (PF) SYRINGE
PREFILLED_SYRINGE | INTRAVENOUS | Status: DC | PRN
  Administered 2017-08-29 (×4): 50 mg via INTRAVENOUS

## 2017-08-29 MED ORDER — CEFAZOLIN SODIUM 1 G IJ SOLR
INTRAMUSCULAR | Status: AC
  Filled 2017-08-29: qty 20

## 2017-08-29 MED ORDER — PROPOFOL 10 MG/ML IV BOLUS
INTRAVENOUS | Status: AC
  Filled 2017-08-29: qty 20

## 2017-08-29 MED ORDER — MIDAZOLAM HCL 5 MG/5ML IJ SOLN
INTRAMUSCULAR | Status: DC | PRN
  Administered 2017-08-29 (×2): 4 mg via INTRAVENOUS

## 2017-08-29 SURGICAL SUPPLY — 66 items
BIT DRILL 4.3MMS DISTAL GRDTED (BIT) ×1 IMPLANT
BIT DRILL LAG SCREW (DRILL) ×1 IMPLANT
BNDG COHESIVE 6X5 TAN STRL LF (GAUZE/BANDAGES/DRESSINGS) IMPLANT
BONE CANC CHIPS 40CC CAN1/2 (Bone Implant) ×4 IMPLANT
BRUSH SCRUB SURG 4.25 DISP (MISCELLANEOUS) ×4 IMPLANT
CHIPS CANC BONE 40CC CAN1/2 (Bone Implant) ×2 IMPLANT
CORTICAL BONE SCR 5.0MM X 46MM (Screw) ×2 IMPLANT
COVER PERINEAL POST (MISCELLANEOUS) ×2 IMPLANT
COVER SURGICAL LIGHT HANDLE (MISCELLANEOUS) ×4 IMPLANT
DRAPE C-ARMOR (DRAPES) ×2 IMPLANT
DRAPE HALF SHEET 40X57 (DRAPES) ×2 IMPLANT
DRAPE INCISE IOBAN 66X45 STRL (DRAPES) ×2 IMPLANT
DRAPE ORTHO SPLIT 77X108 STRL (DRAPES)
DRAPE STERI IOBAN 125X83 (DRAPES) ×2 IMPLANT
DRAPE SURG ORHT 6 SPLT 77X108 (DRAPES) IMPLANT
DRAPE U-SHAPE 47X51 STRL (DRAPES) ×2 IMPLANT
DRILL 4.3MMS DISTAL GRADUATED (BIT) ×2
DRILL LAG SCREW (DRILL) ×2
DRSG EMULSION OIL 3X3 NADH (GAUZE/BANDAGES/DRESSINGS) ×2 IMPLANT
DRSG MEPILEX BORDER 4X4 (GAUZE/BANDAGES/DRESSINGS) ×2 IMPLANT
DRSG MEPILEX BORDER 4X8 (GAUZE/BANDAGES/DRESSINGS) ×2 IMPLANT
ELECT REM PT RETURN 9FT ADLT (ELECTROSURGICAL) ×2
ELECTRODE REM PT RTRN 9FT ADLT (ELECTROSURGICAL) ×1 IMPLANT
GLOVE BIO SURGEON STRL SZ7.5 (GLOVE) ×2 IMPLANT
GLOVE BIO SURGEON STRL SZ8 (GLOVE) ×2 IMPLANT
GLOVE BIOGEL PI IND STRL 7.5 (GLOVE) ×1 IMPLANT
GLOVE BIOGEL PI IND STRL 8 (GLOVE) ×1 IMPLANT
GLOVE BIOGEL PI INDICATOR 7.5 (GLOVE) ×1
GLOVE BIOGEL PI INDICATOR 8 (GLOVE) ×1
GOWN STRL REUS W/ TWL LRG LVL3 (GOWN DISPOSABLE) ×2 IMPLANT
GOWN STRL REUS W/ TWL XL LVL3 (GOWN DISPOSABLE) ×1 IMPLANT
GOWN STRL REUS W/TWL LRG LVL3 (GOWN DISPOSABLE) ×2
GOWN STRL REUS W/TWL XL LVL3 (GOWN DISPOSABLE) ×1
GUIDEPIN 3.2X17.5 THRD DISP (PIN) ×8 IMPLANT
GUIDEPIN VERSANAIL DSP 3.2X444 (ORTHOPEDIC DISPOSABLE SUPPLIES) ×2 IMPLANT
GUIDEWIRE BALL NOSE 100CM (WIRE) ×4 IMPLANT
HFN A/R SCREW 90MM (Screw) ×2 IMPLANT
HFN LH 130 DEG 9MM X 420MM (Nail) ×2 IMPLANT
KIT BASIN OR (CUSTOM PROCEDURE TRAY) ×2 IMPLANT
KIT TURNOVER KIT B (KITS) ×2 IMPLANT
LINER BOOT UNIVERSAL DISP (MISCELLANEOUS) ×2 IMPLANT
MANIFOLD NEPTUNE II (INSTRUMENTS) IMPLANT
NS IRRIG 1000ML POUR BTL (IV SOLUTION) ×2 IMPLANT
PACK GENERAL/GYN (CUSTOM PROCEDURE TRAY) ×2 IMPLANT
PAD ARMBOARD 7.5X6 YLW CONV (MISCELLANEOUS) ×6 IMPLANT
SCREW BONE CORTICAL 5.0X42 (Screw) ×2 IMPLANT
SCREW CORTICL BON 5.0MM X 46MM (Screw) ×1 IMPLANT
SCREW DRILL BIT ANIT ROTATION (BIT) ×2 IMPLANT
SCREW LAG 10.5MMX105MM HFN (Screw) ×2 IMPLANT
STAPLER VISISTAT 35W (STAPLE) ×2 IMPLANT
STOCKINETTE IMPERVIOUS LG (DRAPES) IMPLANT
SUT ETHILON 2 0 FS 18 (SUTURE) ×2 IMPLANT
SUT ETHILON 2 0 PSLX (SUTURE) ×2 IMPLANT
SUT ETHILON 3 0 PS 1 (SUTURE) ×2 IMPLANT
SUT VIC AB 0 CT1 27 (SUTURE) ×1
SUT VIC AB 0 CT1 27XBRD ANBCTR (SUTURE) ×1 IMPLANT
SUT VIC AB 1 CT1 18XCR BRD 8 (SUTURE) ×1 IMPLANT
SUT VIC AB 1 CT1 27 (SUTURE)
SUT VIC AB 1 CT1 27XBRD ANBCTR (SUTURE) IMPLANT
SUT VIC AB 1 CT1 8-18 (SUTURE) ×1
SUT VIC AB 2-0 CT1 27 (SUTURE) ×1
SUT VIC AB 2-0 CT1 TAPERPNT 27 (SUTURE) ×1 IMPLANT
SYR CONTROL 10ML LL (SYRINGE) ×2 IMPLANT
TOWEL OR 17X24 6PK STRL BLUE (TOWEL DISPOSABLE) ×2 IMPLANT
TOWEL OR 17X26 10 PK STRL BLUE (TOWEL DISPOSABLE) ×4 IMPLANT
WATER STERILE IRR 1000ML POUR (IV SOLUTION) ×2 IMPLANT

## 2017-08-29 NOTE — Transfer of Care (Signed)
Immediate Anesthesia Transfer of Care Note  Patient: Phil Michels  Procedure(s) Performed: INTRAMEDULLARY (IM) NAIL INTERTROCHANTRIC (Left Hip)  Patient Location: ICU  Anesthesia Type:General  Level of Consciousness: sedated, unresponsive and Patient remains intubated per anesthesia plan  Airway & Oxygen Therapy: Patient remains intubated per anesthesia plan and Patient placed on Ventilator (see vital sign flow sheet for setting)  Post-op Assessment: Report given to RN  Post vital signs: Reviewed and stable  Last Vitals:  Vitals Value Taken Time  BP    Temp    Pulse    Resp    SpO2      Last Pain:  Vitals:   08/29/17 0800  TempSrc: Axillary  PainSc:          Complications: No apparent anesthesia complications

## 2017-08-29 NOTE — Anesthesia Postprocedure Evaluation (Signed)
Anesthesia Post Note  Patient: Alan Maddox  Procedure(s) Performed: INTRAMEDULLARY (IM) NAIL INTERTROCHANTRIC (Left Hip)     Patient location during evaluation: SICU Anesthesia Type: General Level of consciousness: sedated Pain management: pain level controlled Vital Signs Assessment: post-procedure vital signs reviewed and stable Respiratory status: patient remains intubated per anesthesia plan Cardiovascular status: stable Postop Assessment: no apparent nausea or vomiting Anesthetic complications: no    Last Vitals:  Vitals:   08/29/17 1420 08/29/17 1500  BP:  (!) 145/91  Pulse:  77  Resp:  20  Temp:  36.5 C  SpO2: 94% 95%    Last Pain:  Vitals:   08/29/17 1500  TempSrc: Axillary  PainSc:                  Shelton Silvas
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
## 2017-08-29 NOTE — Progress Notes (Signed)
Patient ID: Alan Maddox, male   DOB: [birth year], 47 y.o.   MRN: [id number] Patient return from recent femur surgery Currently sedated Previously has been consistently following commands Small frontal contusions will likely resolve on their own I will sign off at this time Please reconsult if further intervention is required

## 2017-08-29 NOTE — Brief Op Note (Signed)
08/29/2017  2:58 PM  PATIENT:  Alan Maddox  48 y.o. male  PRE-OPERATIVE DIAGNOSIS:  COMMINUTED LEFT PROXIMAL FEMUR FRACTURE  POST-OPERATIVE DIAGNOSIS:  COMMINUTED LEFT PROXIMAL FEMUR FRACTURE  PROCEDURE:  Procedure(s): INTRAMEDULLARY (IM) NAIL INTERTROCHANTRIC (Left) WITH BIOMET AFFIXUS NAIL, 9 X 420 statically locked  SURGEON:  Surgeon(s) and Role:    Myrene Galas, MD - Primary  PHYSICIAN ASSISTANT: Montez Morita, PA-C  ANESTHESIA:   general  EBL:  700 mL ; UOP 650cc   ADMINISTERED: 3 u PRBC's, 500 cc Alb, 1500 cc crystalloid  DRAINS: none   LOCAL MEDICATIONS USED:  NONE  SPECIMEN:  No Specimen  DISPOSITION OF SPECIMEN:  N/A  COUNTS:  YES  TOURNIQUET:  * No tourniquets in log *  DICTATION: .Other Dictation: Dictation Number [id number]  PLAN OF CARE: Admit to inpatient   PATIENT DISPOSITION:  ICU - intubated and hemodynamically stable.   Delay start of Pharmacological VTE agent (>24hrs) due to surgical blood loss or risk of bleeding: per Trauma Service
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
## 2017-08-29 NOTE — Plan of Care (Signed)
Patient had planned surgical intervention today and nutrition was not able to be started.  Plan to start tube feeds tomorrow if no extubation occurs.  Heloise Purpura RN

## 2017-08-29 NOTE — Progress Notes (Signed)
Pt returned to full support per MD. Pt going for surgery today. No Weaning
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
## 2017-08-29 NOTE — Anesthesia Preprocedure Evaluation (Addendum)
Anesthesia Evaluation  Patient identified by MRN, date of birth, ID band  Reviewed: Unable to perform ROS - Chart review only  Airway Mallampati: Intubated       Dental  (+) Teeth Intact   Pulmonary    breath sounds clear to auscultation       Cardiovascular  Rhythm:Regular Rate:Tachycardia     Neuro/Psych    GI/Hepatic   Endo/Other    Renal/GU      Musculoskeletal   Abdominal   Peds  Hematology   Anesthesia Other Findings   Reproductive/Obstetrics                            Anesthesia Physical  Anesthesia Plan  ASA: III  Anesthesia Plan: General   Post-op Pain Management:    Induction: Inhalational  PONV Risk Score and Plan: 2 and Ondansetron, Midazolam and Treatment may vary due to age or medical condition  Airway Management Planned: Oral ETT  Additional Equipment:   Intra-op Plan:   Post-operative Plan: Post-operative intubation/ventilation  Informed Consent: I have reviewed the patients History and Physical, chart, labs and discussed the procedure including the risks, benefits and alternatives for the proposed anesthesia with the patient or authorized representative who has indicated his/her understanding and acceptance.   History available from chart only  Plan Discussed with: CRNA  Anesthesia Plan Comments:         Anesthesia Quick Evaluation

## 2017-08-30 ENCOUNTER — Encounter (HOSPITAL_COMMUNITY): Payer: Self-pay | Admitting: Orthopedic Surgery

## 2017-08-30 ENCOUNTER — Inpatient Hospital Stay (HOSPITAL_COMMUNITY): Payer: No Typology Code available for payment source

## 2017-08-30 LAB — POCT I-STAT 3, ART BLOOD GAS (G3+)
Bicarbonate: 25.8 mmol/L (ref 20.0–28.0)
O2 Saturation: 95 %
PCO2 ART: 44.7 mmHg (ref 32.0–48.0)
PH ART: 7.368 (ref 7.350–7.450)
Patient temperature: 98
TCO2: 27 mmol/L (ref 22–32)
pO2, Arterial: 77 mmHg — ABNORMAL LOW (ref 83.0–108.0)

## 2017-08-30 LAB — BASIC METABOLIC PANEL
Anion gap: 4 — ABNORMAL LOW (ref 5–15)
BUN: 11 mg/dL (ref 6–20)
CHLORIDE: 109 mmol/L (ref 101–111)
CO2: 27 mmol/L (ref 22–32)
Calcium: 7.7 mg/dL — ABNORMAL LOW (ref 8.9–10.3)
Creatinine, Ser: 0.75 mg/dL (ref 0.61–1.24)
GFR calc Af Amer: >60 mL/min (ref >60)
GFR calc non Af Amer: >60 mL/min (ref >60)
Glucose, Bld: 115 mg/dL — ABNORMAL HIGH (ref 65–99)
POTASSIUM: 4 mmol/L (ref 3.5–5.1)
Sodium: 140 mmol/L (ref 135–145)

## 2017-08-30 LAB — POCT I-STAT 7, (LYTES, BLD GAS, ICA,H+H)
Acid-base deficit: 2 mmol/L (ref 0.0–2.0)
Bicarbonate: 23.8 mmol/L (ref 20.0–28.0)
Calcium, Ion: 1.11 mmol/L — ABNORMAL LOW (ref 1.15–1.40)
HCT: 21 % — ABNORMAL LOW (ref 39.0–52.0)
HEMOGLOBIN: 7.1 g/dL — AB (ref 13.0–17.0)
O2 Saturation: 95 %
PCO2 ART: 44.4 mmHg (ref 32.0–48.0)
PO2 ART: 78 mmHg — AB (ref 83.0–108.0)
Patient temperature: 36.8
Potassium: 4.1 mmol/L (ref 3.5–5.1)
Sodium: 140 mmol/L (ref 135–145)
TCO2: 25 mmol/L (ref 22–32)
pH, Arterial: 7.336 — ABNORMAL LOW (ref 7.350–7.450)

## 2017-08-30 LAB — CBC WITH DIFFERENTIAL/PLATELET
BASOS PCT: 0 %
Basophils Absolute: 0 10*3/uL (ref 0.0–0.1)
EOS ABS: 0.1 10*3/uL (ref 0.0–0.7)
Eosinophils Relative: 1 %
HCT: 27 % — ABNORMAL LOW (ref 39.0–52.0)
Hemoglobin: 9.1 g/dL — ABNORMAL LOW (ref 13.0–17.0)
LYMPHS PCT: 15 %
Lymphs Abs: 1.9 10*3/uL (ref 0.7–4.0)
MCH: 30.2 pg (ref 26.0–34.0)
MCHC: 33.7 g/dL (ref 30.0–36.0)
MCV: 89.7 fL (ref 78.0–100.0)
Monocytes Absolute: 1.2 10*3/uL — ABNORMAL HIGH (ref 0.1–1.0)
Monocytes Relative: 10 %
NEUTROS ABS: 9.2 10*3/uL — AB (ref 1.7–7.7)
Neutrophils Relative %: 74 %
PLATELETS: 162 10*3/uL (ref 150–400)
RBC: 3.01 MIL/uL — ABNORMAL LOW (ref 4.22–5.81)
RDW: 14.9 % (ref 11.5–15.5)
WBC: 12.4 10*3/uL — ABNORMAL HIGH (ref 4.0–10.5)

## 2017-08-30 LAB — MAGNESIUM: MAGNESIUM: 2.1 mg/dL (ref 1.7–2.4)

## 2017-08-30 LAB — GLUCOSE, CAPILLARY: Glucose-Capillary: 127 mg/dL — ABNORMAL HIGH (ref 65–99)

## 2017-08-30 LAB — PHOSPHORUS: Phosphorus: 2.1 mg/dL — ABNORMAL LOW (ref 2.5–4.6)

## 2017-08-30 MED ORDER — FUROSEMIDE 10 MG/ML IJ SOLN
40.0000 mg | Freq: Once | INTRAMUSCULAR | Status: AC
  Administered 2017-08-30: 40 mg via INTRAVENOUS
  Filled 2017-08-30: qty 4

## 2017-08-30 MED ORDER — PRO-STAT SUGAR FREE PO LIQD
60.0000 mL | Freq: Three times a day (TID) | ORAL | Status: DC
  Administered 2017-08-30 – 2017-09-01 (×8): 60 mL
  Filled 2017-08-30 (×8): qty 60

## 2017-08-30 MED ORDER — PIVOT 1.5 CAL PO LIQD
1000.0000 mL | ORAL | Status: DC
  Administered 2017-08-30 – 2017-09-01 (×3): 1000 mL
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
## 2017-08-30 NOTE — Progress Notes (Signed)
Orthopaedic Trauma Service (OTS)  1 Day Post-Op Procedure(s) (LRB): INTRAMEDULLARY (IM) NAIL INTERTROCHANTRIC (Left)  Subjective: Patient intubated and sedated but following commands.  Objective: Current Vitals Blood pressure 136/67, pulse 91, temperature 98.1 F (36.7 C), temperature source Oral, resp. rate 19, height  (1.88 m), weight 108.9 kg (240 lb), SpO2 92 %. Vital signs in last 24 hours: Temp:  [97.7 F (36.5 C)-98.5 F (36.9 C)] 98.1 F (36.7 C) (05/08 0400) Pulse Rate:  [77-97] 91 (05/08 0700) Resp:  [9-24] 19 (05/08 0700) BP: (104-156)/(56-91) 136/67 (05/08 0700) SpO2:  [91 %-100 %] 92 % (05/08 0815) Arterial Line BP: (126-180)/(61-91) 135/63 (05/08 0700) FiO2 (%):  [40 %-70 %] 50 % (05/08 0815)  Intake/Output from previous day: 05/07 0701 - 05/08 0700 In: 6136.9 [I.V.:4706.9; Blood:630; IV Piggyback:800] Out: 4021 [Urine:3231; Drains:90; Blood:700]  LABS Recent Labs    08/29/17 0532 08/29/17 1239 08/29/17 1342 08/29/17 1430 08/30/17 0509  HGB 9.4* 8.2* 10.9* 9.8* 9.1*   Recent Labs    08/29/17 0532  08/29/17 1430 08/30/17 0509  WBC 16.1*  --   --  12.4*  RBC 3.08*  --   --  3.01*  HCT 27.5*   < > 28.9* 27.0*  PLT 179  --   --  162   < > = values in this interval not displayed.   Recent Labs    08/29/17 0532  08/29/17 1342 08/30/17 0509  NA 139   < > 169* 140  K 4.3   < > 4.7 4.0  CL 108  --   --  109  CO2 24  --   --  27  BUN 17  --   --  11  CREATININE 0.87  --   --  0.75  GLUCOSE 114*   < > 127* 115*  CALCIUM 7.4*  --   --  7.7*   < > = values in this interval not displayed.   Recent Labs    08/28/17 0028 08/29/17 0532  INR 1.43 1.17     Physical Exam R&LUE in mittens LLE Dressing intact, sanguinous drainage  Leg rotation symmetric  Edema/ swelling moderate  Motor: EHL, FHL, and lessor toe ext and flex all intact grossly  Brisk cap refill, warm to touch, DP 2+  Assessment/Plan: 1 Day Post-Op Procedure(s)  (LRB): INTRAMEDULLARY (IM) NAIL INTERTROCHANTRIC (Left) 1. PT/OT TDWB on LLE, no ROM precautions 2. DVT proph per Trauma Service 3. Will change dressing tomorrow  Myrene Galas, MD [facility], [facility] [phone]
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
## 2017-08-31 LAB — BASIC METABOLIC PANEL
Anion gap: 5 (ref 5–15)
BUN: 18 mg/dL (ref 6–20)
CALCIUM: 7.5 mg/dL — AB (ref 8.9–10.3)
CO2: 28 mmol/L (ref 22–32)
CREATININE: 0.73 mg/dL (ref 0.61–1.24)
Chloride: 111 mmol/L (ref 101–111)
GFR calc Af Amer: >60 mL/min (ref >60)
GFR calc non Af Amer: >60 mL/min (ref >60)
GLUCOSE: 127 mg/dL — AB (ref 65–99)
Potassium: 3.4 mmol/L — ABNORMAL LOW (ref 3.5–5.1)
Sodium: 144 mmol/L (ref 135–145)

## 2017-08-31 LAB — TYPE AND SCREEN
ABO/RH(D): A POS
Antibody Screen: NEGATIVE
Unit division: 0
Unit division: 0
Unit division: 0
Unit division: 0
Unit division: 0
Unit division: 0
Unit division: 0
Unit division: 0
Unit division: 0
Unit division: 0
Unit division: 0
Unit division: 0

## 2017-08-31 LAB — GLUCOSE, CAPILLARY
GLUCOSE-CAPILLARY: 115 mg/dL — AB (ref 65–99)
GLUCOSE-CAPILLARY: 116 mg/dL — AB (ref 65–99)
GLUCOSE-CAPILLARY: 119 mg/dL — AB (ref 65–99)
GLUCOSE-CAPILLARY: 120 mg/dL — AB (ref 65–99)
Glucose-Capillary: 122 mg/dL — ABNORMAL HIGH (ref 65–99)
Glucose-Capillary: 127 mg/dL — ABNORMAL HIGH (ref 65–99)
Glucose-Capillary: 149 mg/dL — ABNORMAL HIGH (ref 65–99)

## 2017-08-31 LAB — BPAM RBC
Blood Product Expiration Date: 201905242359
Blood Product Expiration Date: 201905242359
Blood Product Expiration Date: 201905262359
Blood Product Expiration Date: 201905262359
Blood Product Expiration Date: 201905262359
Blood Product Expiration Date: 201905262359
Blood Product Expiration Date: 201905262359
Blood Product Expiration Date: 201905272359
Blood Product Expiration Date: 201905272359
Blood Product Expiration Date: 201905282359
Blood Product Expiration Date: 201906012359
Blood Product Expiration Date: 201906012359
ISSUE DATE / TIME: 201905051843
ISSUE DATE / TIME: 201905051843
ISSUE DATE / TIME: 201905052025
ISSUE DATE / TIME: 201905052025
ISSUE DATE / TIME: 201905052116
ISSUE DATE / TIME: 201905052329
ISSUE DATE / TIME: 201905060741
ISSUE DATE / TIME: 201905061333
ISSUE DATE / TIME: 201905061508
ISSUE DATE / TIME: 201905062042
ISSUE DATE / TIME: 201905071150
ISSUE DATE / TIME: 201905071150
Unit Type and Rh: 6200
Unit Type and Rh: 6200
Unit Type and Rh: 6200
Unit Type and Rh: 6200
Unit Type and Rh: 6200
Unit Type and Rh: 6200
Unit Type and Rh: 6200
Unit Type and Rh: 6200
Unit Type and Rh: 6200
Unit Type and Rh: 6200
Unit Type and Rh: 9500
Unit Type and Rh: 9500

## 2017-08-31 LAB — CBC WITH DIFFERENTIAL/PLATELET
BASOS PCT: 0 %
Basophils Absolute: 0 10*3/uL (ref 0.0–0.1)
EOS ABS: 0.6 10*3/uL (ref 0.0–0.7)
Eosinophils Relative: 5 %
HEMATOCRIT: 25 % — AB (ref 39.0–52.0)
Hemoglobin: 8.2 g/dL — ABNORMAL LOW (ref 13.0–17.0)
Lymphocytes Relative: 19 %
Lymphs Abs: 2.4 10*3/uL (ref 0.7–4.0)
MCH: 30 pg (ref 26.0–34.0)
MCHC: 32.8 g/dL (ref 30.0–36.0)
MCV: 91.6 fL (ref 78.0–100.0)
MONO ABS: 1.9 10*3/uL — AB (ref 0.1–1.0)
Monocytes Relative: 15 %
NEUTROS ABS: 7.6 10*3/uL (ref 1.7–7.7)
Neutrophils Relative %: 61 %
Platelets: 232 10*3/uL (ref 150–400)
RBC: 2.73 MIL/uL — ABNORMAL LOW (ref 4.22–5.81)
RDW: 15.5 % (ref 11.5–15.5)
WBC: 12.5 10*3/uL — ABNORMAL HIGH (ref 4.0–10.5)

## 2017-08-31 LAB — MAGNESIUM
Magnesium: 2.3 mg/dL (ref 1.7–2.4)
Magnesium: 2.4 mg/dL (ref 1.7–2.4)

## 2017-08-31 LAB — PHOSPHORUS
Phosphorus: 1.8 mg/dL — ABNORMAL LOW (ref 2.5–4.6)
Phosphorus: 2 mg/dL — ABNORMAL LOW (ref 2.5–4.6)

## 2017-08-31 LAB — TRIGLYCERIDES: TRIGLYCERIDES: 133 mg/dL (ref <150)

## 2017-08-31 MED ORDER — ADULT MULTIVITAMIN LIQUID CH
15.0000 mL | Freq: Every day | ORAL | Status: DC
  Administered 2017-08-31 – 2017-09-01 (×2): 15 mL
  Filled 2017-08-31 (×2): qty 15

## 2017-08-31 MED ORDER — DEXMEDETOMIDINE HCL IN NACL 400 MCG/100ML IV SOLN
0.4000 ug/kg/h | INTRAVENOUS | Status: DC
  Administered 2017-08-31 (×2): 1 ug/kg/h via INTRAVENOUS
  Administered 2017-08-31 – 2017-09-01 (×5): 0.8 ug/kg/h via INTRAVENOUS
  Administered 2017-09-01 (×2): 0.7 ug/kg/h via INTRAVENOUS
  Administered 2017-09-02 (×2): 0.9 ug/kg/h via INTRAVENOUS
  Administered 2017-09-02 (×2): 0.7 ug/kg/h via INTRAVENOUS
  Administered 2017-09-02 – 2017-09-03 (×2): 0.8 ug/kg/h via INTRAVENOUS
  Administered 2017-09-03: 0.6 ug/kg/h via INTRAVENOUS
  Administered 2017-09-03: 0.601 ug/kg/h via INTRAVENOUS
  Administered 2017-09-03: 0.9 ug/kg/h via INTRAVENOUS
  Administered 2017-09-03: 0.6 ug/kg/h via INTRAVENOUS
  Administered 2017-09-04: 0.7 ug/kg/h via INTRAVENOUS
  Administered 2017-09-04: 0.6 ug/kg/h via INTRAVENOUS
  Filled 2017-08-31 (×20): qty 100

## 2017-08-31 MED ORDER — MUPIROCIN 2 % EX OINT
TOPICAL_OINTMENT | Freq: Every day | CUTANEOUS | Status: DC
  Administered 2017-08-31 – 2017-09-07 (×6): via TOPICAL
  Administered 2017-09-08 – 2017-09-10 (×3): 1 via TOPICAL
  Administered 2017-09-11 – 2017-09-13 (×3): via TOPICAL
  Filled 2017-08-31 (×3): qty 22

## 2017-08-31 MED ORDER — OXYCODONE HCL 5 MG/5ML PO SOLN
10.0000 mg | ORAL | Status: DC
  Administered 2017-08-31 – 2017-09-02 (×12): 10 mg
  Filled 2017-08-31 (×13): qty 10

## 2017-08-31 MED ORDER — DEXMEDETOMIDINE HCL IN NACL 200 MCG/50ML IV SOLN
0.0000 ug/kg/h | INTRAVENOUS | Status: DC
  Administered 2017-08-31: 0.4 ug/kg/h via INTRAVENOUS
  Administered 2017-08-31: 0.6 ug/kg/h via INTRAVENOUS
  Filled 2017-08-31 (×2): qty 50

## 2017-08-31 MED ORDER — ACETAMINOPHEN 160 MG/5ML PO SOLN
650.0000 mg | ORAL | Status: DC | PRN
  Administered 2017-09-01 (×2): 650 mg
  Filled 2017-08-31 (×3): qty 20.3

## 2017-08-31 MED ORDER — QUETIAPINE FUMARATE 25 MG PO TABS
50.0000 mg | ORAL_TABLET | Freq: Two times a day (BID) | ORAL | Status: DC
  Administered 2017-08-31 – 2017-09-04 (×7): 50 mg
  Filled 2017-08-31 (×8): qty 2

## 2017-08-31 MED ORDER — ACETAMINOPHEN 325 MG PO TABS
650.0000 mg | ORAL_TABLET | ORAL | Status: DC | PRN
  Administered 2017-09-02: 650 mg via ORAL
  Filled 2017-08-31: qty 2

## 2017-08-31 MED ORDER — ACETAMINOPHEN 650 MG RE SUPP: 650.0000 mg | RECTAL | Status: DC | PRN
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
## 2017-08-31 NOTE — Progress Notes (Signed)
Follow up - Trauma and Critical Care  Patient Details:    Alan Maddox is an 47 y.o. male.  Lines/tubes : Airway 8 mm (Active)  Secured at (cm) 25 cm 08/31/2017  7:56 AM  Measured From Lips 08/31/2017  7:56 AM  Secured Location Center 08/31/2017  7:56 AM  Secured By [employer] 08/31/2017  7:56 AM  Tube Holder Repositioned Yes 08/31/2017  7:56 AM  Cuff Pressure (cm H2O) 28 cm H2O 08/30/2017  7:46 PM  Site Condition Dry 08/31/2017  7:56 AM     Arterial Line 08/27/17 Radial (Active)  Site Assessment Clean;Dry;Intact 08/30/2017  8:00 PM  Line Status Pulsatile blood flow 08/30/2017  8:00 PM  Art Line Waveform Whip 08/30/2017  8:00 PM  Art Line Interventions Zeroed and calibrated;Leveled;Connections checked and tightened;Flushed per protocol 08/28/2017  8:00 PM  Color/Movement/Sensation Capillary refill less than 3 sec 08/30/2017  8:00 PM  Dressing Type Transparent;Occlusive 08/30/2017  8:00 PM  Dressing Status Clean;Dry;Intact 08/30/2017  8:00 PM  Dressing Change Due 09/03/17 08/30/2017  8:00 PM     Negative Pressure Wound Therapy (Active)  Last dressing change 08/30/17 08/30/2017  8:00 PM  Site / Wound Assessment Dressing in place / Unable to assess 08/30/2017  8:00 PM  Peri-wound Assessment Intact 08/30/2017  8:00 PM  Cycle Continuous 08/30/2017  8:00 PM  Target Pressure (mmHg) 125 08/30/2017  8:00 PM  Dressing Status Intact 08/30/2017  8:00 PM  Drainage Amount Minimal 08/30/2017  8:00 PM  Drainage Description Serosanguineous 08/30/2017  8:00 PM  Output (mL) 25 mL 08/31/2017  6:00 AM     NG/OG Tube Orogastric 16 Fr. Center mouth Aucultation;Xray Documented cm marking at nare/ corner of mouth (Active)  Site Assessment Clean;Dry;Intact 08/30/2017  8:00 PM  Ongoing Placement Verification No change in cm markings or external length of tube from initial placement;No change in respiratory status;No acute changes, not attributed to clinical condition 08/30/2017  8:00 PM  Status Infusing tube feed 08/30/2017  8:00 PM   Amount of suction 105 mmHg 08/30/2017  8:00 AM  Output (mL) 0 mL 08/31/2017  6:00 AM     Urethral Catheter Dr. Dwain Sarna Latex 14 Fr. (Active)  Indication for Insertion or Continuance of Catheter Other (comment) 08/30/2017  8:00 PM  Site Assessment Clean;Intact 08/30/2017  8:00 PM  Catheter Maintenance Bag below level of bladder;Catheter secured;Drainage bag/tubing not touching floor;Insertion date on drainage bag;No dependent loops;Seal intact;Bag emptied prior to transport 08/30/2017  8:00 PM  Collection Container Standard drainage bag 08/30/2017  8:00 PM  Securement Method Leg strap 08/30/2017  8:00 PM  Output (mL) 150 mL 08/31/2017  6:00 AM    Microbiology/Sepsis markers: Results for orders placed or performed during the hospital encounter of 08/27/17  MRSA PCR Screening     Status: None   Collection Time: 08/28/17 12:01 AM  Result Value Ref Range Status   MRSA by PCR NEGATIVE NEGATIVE Final    Comment:        The GeneXpert MRSA Assay (FDA approved for NASAL specimens only), is one component of a comprehensive MRSA colonization surveillance program. It is not intended to diagnose MRSA infection nor to guide or monitor treatment for MRSA infections. Performed at [facility], [address], [place], [place] [address]   Surgical pcr screen     Status: None   Collection Time: 08/29/17  5:40 AM  Result Value Ref Range Status   MRSA, PCR NEGATIVE NEGATIVE Final   Staphylococcus aureus NEGATIVE NEGATIVE  Final    Comment: (NOTE) The Xpert SA Assay (FDA approved for NASAL specimens in patients 60 years of age and older), is one component of a comprehensive surveillance program. It is not intended to diagnose infection nor to guide or monitor treatment. Performed at [facility], [address], [place], [place] [address]     Anti-infectives:  Anti-infectives (From admission, onward)   Start     Dose/Rate Route Frequency Ordered Stop   08/29/17 1445  ceFAZolin (ANCEF) IVPB  2g/100 mL premix     2 g 200 mL/hr over 30 Minutes Intravenous Every 8 hours 08/29/17 1435 08/30/17 0643   08/29/17 0945  ceFAZolin (ANCEF) IVPB 1 g/50 mL premix     1 g 100 mL/hr over 30 Minutes Intravenous On call to O.R. 08/29/17 6045 08/30/17 0559      Best Practice/Protocols:  VTE Prophylaxis: Mechanical Intermittent Sedation  Consults: Treatment Team:  Md, Trauma, MD Haddix, Gillie Manners, MD    Events:  Chief Complaint/Subjective:    Overnight Issues: Apneic on SBT  Objective:  Vital signs for last 24 hours: Temp:  [98.4 F (36.9 C)-99.4 F (37.4 C)] 98.4 F (36.9 C) (05/09 0813) Pulse Rate:  [77-101] 81 (05/09 0800) Resp:  [14-25] 20 (05/09 0800) BP: (128-150)/(60-88) 133/71 (05/09 0813) SpO2:  [94 %-97 %] 97 % (05/09 0800) Arterial Line BP: (110-150)/(51-66) 128/62 (05/09 0800) FiO2 (%):  [50 %] 50 % (05/09 0800) Weight:  [118.9 kg (262 lb 2 oz)-119.9 kg (264 lb 5.3 oz)] 119.9 kg (264 lb 5.3 oz) (05/09 0422)  Hemodynamic parameters for last 24 hours:    Intake/Output from previous day: 05/08 0701 - 05/09 0700 In: 4233.6 [I.V.:3556.1; NG/GT:577.5; IV Piggyback:100] Out: 5500 [Urine:5050; Emesis/NG output:425; Drains:25]  Intake/Output this shift: Total I/O In: 167.2 [I.V.:132.2; NG/GT:35] Out: -   Vent settings for last 24 hours: Vent Mode: PRVC FiO2 (%):  [50 %] 50 % Set Rate:  [20 bmp] 20 bmp Vt Set:  [409 mL] 660 mL PEEP:  [5 cmH20] 5 cmH20 Plateau Pressure:  [18 cmH20-21 cmH20] 18 cmH20  Physical Exam:  Gen: intubated, sedated HEENT: ETT and NG tube in place Resp: CTAB, assisted with PRVC Cardiovascular: RRR Abdomen: soft, vac in place, ND Ext: no edema Neuro: GCS 10t  Results for orders placed or performed during the hospital encounter of 08/27/17 (from the past 24 hour(s))  I-STAT 3, arterial blood gas (G3+)     Status: Abnormal   Collection Time: 08/30/17 11:12 AM  Result Value Ref Range   pH, Arterial 7.368 7.350 - 7.450   pCO2  arterial 44.7 32.0 - 48.0 mmHg   pO2, Arterial 77.0 (L) 83.0 - 108.0 mmHg   Bicarbonate 25.8 20.0 - 28.0 mmol/L   TCO2 27 22 - 32 mmol/L   O2 Saturation 95.0 %   Patient temperature 98.0 F    Sample type ARTERIAL   Glucose, capillary     Status: Abnormal   Collection Time: 08/30/17  4:12 PM  Result Value Ref Range   Glucose-Capillary 127 (H) 65 - 99 mg/dL  Magnesium     Status: None   Collection Time: 08/30/17  4:20 PM  Result Value Ref Range   Magnesium 2.1 1.7 - 2.4 mg/dL  Phosphorus     Status: Abnormal   Collection Time: 08/30/17  4:20 PM  Result Value Ref Range   Phosphorus 2.1 (L) 2.5 - 4.6 mg/dL  Triglycerides     Status: None   Collection Time:  08/31/17  2:42 AM  Result Value Ref Range   Triglycerides 133 <150 mg/dL  CBC with Differential/Platelet     Status: Abnormal   Collection Time: 08/31/17  2:42 AM  Result Value Ref Range   WBC 12.5 (H) 4.0 - 10.5 K/uL   RBC 2.73 (L) 4.22 - 5.81 MIL/uL   Hemoglobin 8.2 (L) 13.0 - 17.0 g/dL   HCT 16.1 (L) 09.6 - 04.5 %   MCV 91.6 78.0 - 100.0 fL   MCH 30.0 26.0 - 34.0 pg   MCHC 32.8 30.0 - 36.0 g/dL   RDW 40.9 81.1 - 91.4 %   Platelets 232 150 - 400 K/uL   Neutrophils Relative % 61 %   Lymphocytes Relative 19 %   Monocytes Relative 15 %   Eosinophils Relative 5 %   Basophils Relative 0 %   Neutro Abs 7.6 1.7 - 7.7 K/uL   Lymphs Abs 2.4 0.7 - 4.0 K/uL   Monocytes Absolute 1.9 (H) 0.1 - 1.0 K/uL   Eosinophils Absolute 0.6 0.0 - 0.7 K/uL   Basophils Absolute 0.0 0.0 - 0.1 K/uL   RBC Morphology POLYCHROMASIA PRESENT    WBC Morphology MILD LEFT SHIFT (1-5% METAS, OCC MYELO, OCC BANDS)   Basic metabolic panel     Status: Abnormal   Collection Time: 08/31/17  2:42 AM  Result Value Ref Range   Sodium 144 135 - 145 mmol/L   Potassium 3.4 (L) 3.5 - 5.1 mmol/L   Chloride 111 101 - 111 mmol/L   CO2 28 22 - 32 mmol/L   Glucose, Bld 127 (H) 65 - 99 mg/dL   BUN 18 6 - 20 mg/dL   Creatinine, Ser 7.82 0.61 - 1.24 mg/dL   Calcium  7.5 (L) 8.9 - 10.3 mg/dL   GFR calc non Af Amer >60 >60 mL/min   GFR calc Af Amer >60 >60 mL/min   Anion gap 5 5 - 15  Magnesium     Status: None   Collection Time: 08/31/17  2:42 AM  Result Value Ref Range   Magnesium 2.3 1.7 - 2.4 mg/dL  Phosphorus     Status: Abnormal   Collection Time: 08/31/17  2:42 AM  Result Value Ref Range   Phosphorus 1.8 (L) 2.5 - 4.6 mg/dL  Glucose, capillary     Status: Abnormal   Collection Time: 08/31/17  3:47 AM  Result Value Ref Range   Glucose-Capillary 122 (H) 65 - 99 mg/dL  Glucose, capillary     Status: Abnormal   Collection Time: 08/31/17  8:19 AM  Result Value Ref Range   Glucose-Capillary 119 (H) 65 - 99 mg/dL     Assessment/Plan:   NEURO  Sedated for ventilation   Plan: add precedex, wean propofol to off, add oxy per tube, decrease fentanyl  PULM  Ventilated, apneic on SBT   Plan: wean sedation and retry  CARDIO  No issues   Plan: continue to monitor  RENAL  No issues   Plan: decrease IVF  GI  splenectomy   Plan: tolerating tube feeds, continue vac therapy  ID  Low grade temp, WBC 12.5 stable   Plan: monitor for postsplenectomy infection and postsplenectomy leukocytosis  HEME  Hgb 9.1-->8.2   Plan: continue to monitor  ENDO No issues   Plan: intermittent glc monitor  Global Issues      LOS: 4 days   Additional comments:I reviewed the patient's new clinical lab test results. see above  Critical Care Total Time*: 30 Minutes  De Blanch Kinsinger 08/31/2017  *Care during the described time interval was provided by me and/or other providers on the critical care team.  I have reviewed this patient's available data, including medical history, events of note, physical examination and test results as part of my evaluation.

## 2017-09-01 LAB — POCT I-STAT 3, ART BLOOD GAS (G3+)
ACID-BASE EXCESS: 1 mmol/L (ref 0.0–2.0)
BICARBONATE: 25.5 mmol/L (ref 20.0–28.0)
O2 SAT: 96 %
PO2 ART: 77 mmHg — AB (ref 83.0–108.0)
TCO2: 27 mmol/L (ref 22–32)
pCO2 arterial: 36.9 mmHg (ref 32.0–48.0)
pH, Arterial: 7.447 (ref 7.350–7.450)

## 2017-09-01 LAB — GLUCOSE, CAPILLARY
GLUCOSE-CAPILLARY: 121 mg/dL — AB (ref 65–99)
GLUCOSE-CAPILLARY: 129 mg/dL — AB (ref 65–99)
Glucose-Capillary: 131 mg/dL — ABNORMAL HIGH (ref 65–99)
Glucose-Capillary: 152 mg/dL — ABNORMAL HIGH (ref 65–99)
Glucose-Capillary: 139 mg/dL — ABNORMAL HIGH (ref 65–99)
Glucose-Capillary: 126 mg/dL — ABNORMAL HIGH (ref 65–99)

## 2017-09-01 LAB — CBC
HEMATOCRIT: 24.3 % — AB (ref 39.0–52.0)
Hemoglobin: 7.8 g/dL — ABNORMAL LOW (ref 13.0–17.0)
MCH: 29.9 pg (ref 26.0–34.0)
MCHC: 32.1 g/dL (ref 30.0–36.0)
MCV: 93.1 fL (ref 78.0–100.0)
PLATELETS: 284 10*3/uL (ref 150–400)
RBC: 2.61 MIL/uL — ABNORMAL LOW (ref 4.22–5.81)
RDW: 15.4 % (ref 11.5–15.5)
WBC: 11.9 10*3/uL — AB (ref 4.0–10.5)

## 2017-09-01 LAB — MAGNESIUM: Magnesium: 2.5 mg/dL — ABNORMAL HIGH (ref 1.7–2.4)

## 2017-09-01 LAB — PHOSPHORUS: PHOSPHORUS: 2.5 mg/dL (ref 2.5–4.6)

## 2017-09-01 MED ORDER — ENOXAPARIN SODIUM 40 MG/0.4ML ~~LOC~~ SOLN
40.0000 mg | SUBCUTANEOUS | Status: DC
  Administered 2017-09-01 – 2017-09-13 (×13): 40 mg via SUBCUTANEOUS
  Filled 2017-09-01 (×13): qty 0.4

## 2017-09-01 MED ORDER — FUROSEMIDE 10 MG/ML IJ SOLN
40.0000 mg | Freq: Once | INTRAMUSCULAR | Status: AC
Start: 2017-09-01 — End: 2017-09-01
  Administered 2017-09-01: 40 mg via INTRAVENOUS
  Filled 2017-09-01: qty 4

## 2017-09-01 NOTE — Plan of Care (Signed)
Pt weaned from ventilator for several hours today

## 2017-09-01 NOTE — Progress Notes (Signed)
Follow up - Trauma and Critical Care  Patient Details:    Alan Maddox is an 47 y.o. male.  Lines/tubes : Airway 8 mm (Active)  Secured at (cm) 25 cm 09/01/2017  8:21 AM  Measured From Lips 09/01/2017  8:21 AM  Secured Location Right 09/01/2017  8:21 AM  Secured By [employer] 09/01/2017  8:21 AM  Tube Holder Repositioned Yes 09/01/2017  8:21 AM  Cuff Pressure (cm H2O) 26 cm H2O 08/31/2017  8:33 PM  Site Condition Dry 09/01/2017  8:21 AM     Arterial Line 08/27/17 Radial (Active)  Site Assessment Clean;Dry;Intact 08/31/2017  8:00 PM  Line Status Positional;Pulsatile blood flow 08/31/2017  8:00 PM  Art Line Waveform Whip 08/31/2017  8:00 PM  Art Line Interventions Zeroed and calibrated;Leveled;Connections checked and tightened 08/31/2017  8:00 PM  Color/Movement/Sensation Capillary refill less than 3 sec 08/31/2017  8:00 PM  Dressing Type Transparent;Occlusive 08/31/2017  8:00 PM  Dressing Status Clean;Dry;Intact 08/31/2017  8:00 PM  Dressing Change Due 09/03/17 08/31/2017  8:00 PM     Negative Pressure Wound Therapy (Active)  Last dressing change 08/30/17 08/31/2017  8:00 PM  Site / Wound Assessment Dressing in place / Unable to assess 08/31/2017  8:00 PM  Peri-wound Assessment Intact 08/31/2017  8:00 PM  Cycle Continuous 08/31/2017  8:00 PM  Target Pressure (mmHg) 125 08/31/2017  8:00 PM  Dressing Status Intact 08/31/2017  8:00 PM  Drainage Amount Minimal 08/31/2017  8:00 PM  Drainage Description Serosanguineous 08/31/2017  8:00 PM  Output (mL) 25 mL 08/31/2017  6:00 AM     NG/OG Tube Orogastric 16 Fr. Center mouth Aucultation;Xray Documented cm marking at nare/ corner of mouth (Active)  Site Assessment Clean;Dry;Intact 08/31/2017  8:00 PM  Ongoing Placement Verification No change in cm markings or external length of tube from initial placement;No change in respiratory status;No acute changes, not attributed to clinical condition 08/31/2017  8:00 PM  Status Infusing tube feed 08/31/2017  8:00 PM   Amount of suction 105 mmHg 08/30/2017  8:00 AM  Output (mL) 0 mL 08/31/2017  6:00 AM     Urethral Catheter Dr. Dwain Sarna Latex 14 Fr. (Active)  Indication for Insertion or Continuance of Catheter Other (comment) 09/01/2017  8:00 AM  Site Assessment Clean;Intact 08/31/2017  8:00 PM  Catheter Maintenance Bag below level of bladder;Catheter secured;Drainage bag/tubing not touching floor;Insertion date on drainage bag;No dependent loops;Seal intact;Bag emptied prior to transport 09/01/2017  8:00 AM  Collection Container Standard drainage bag 08/31/2017  8:00 PM  Securement Method Leg strap 08/31/2017  8:00 PM  Urinary Catheter Interventions Unclamped 08/31/2017  8:00 AM  Output (mL) 250 mL 09/01/2017  8:00 AM    Microbiology/Sepsis markers: Results for orders placed or performed during the hospital encounter of 08/27/17  MRSA PCR Screening     Status: None   Collection Time: 08/28/17 12:01 AM  Result Value Ref Range Status   MRSA by PCR NEGATIVE NEGATIVE Final    Comment:        The GeneXpert MRSA Assay (FDA approved for NASAL specimens only), is one component of a comprehensive MRSA colonization surveillance program. It is not intended to diagnose MRSA infection nor to guide or monitor treatment for MRSA infections. Performed at [facility], [address], [place], [place] [address]   Surgical pcr screen     Status: None   Collection Time: 08/29/17  5:40 AM  Result Value Ref Range Status   MRSA, PCR NEGATIVE NEGATIVE  Final   Staphylococcus aureus NEGATIVE NEGATIVE Final    Comment: (NOTE) The Xpert SA Assay (FDA approved for NASAL specimens in patients 52 years of age and older), is one component of a comprehensive surveillance program. It is not intended to diagnose infection nor to guide or monitor treatment. Performed at [facility], [address] [place], [place] [address]     Anti-infectives:  Anti-infectives (From admission, onward)   Start     Dose/Rate Route  Frequency Ordered Stop   08/29/17 1445  ceFAZolin (ANCEF) IVPB 2g/100 mL premix     2 g 200 mL/hr over 30 Minutes Intravenous Every 8 hours 08/29/17 1435 08/30/17 0643   08/29/17 0945  ceFAZolin (ANCEF) IVPB 1 g/50 mL premix     1 g 100 mL/hr over 30 Minutes Intravenous On call to O.R. 08/29/17 8119 08/30/17 0559      Best Practice/Protocols:  VTE Prophylaxis: Lovenox (prophylaxtic dose) and Mechanical GI Prophylaxis: Proton Pump Inhibitor Intermittent Sedation Continous Sedation  Consults: Treatment Team:  Md, Trauma, MD Haddix, Gillie Manners, MD    Events:  Subjective:    Overnight Issues: Much more awake this AM.  Objective:  Vital signs for last 24 hours: Temp:  [98.6 F (37 C)-102.4 F (39.1 C)] 100.2 F (37.9 C) (05/10 0800) Pulse Rate:  [58-106] 72 (05/10 0821) Resp:  [0-33] 29 (05/10 0821) BP: (101-152)/(48-92) 112/56 (05/10 0821) SpO2:  [90 %-98 %] 95 % (05/10 0821) Arterial Line BP: (55-163)/(41-84) 121/56 (05/10 0800) FiO2 (%):  [40 %-50 %] 40 % (05/10 0821) Weight:  [118.2 kg (260 lb 9.3 oz)] 118.2 kg (260 lb 9.3 oz) (05/10 0500)  Hemodynamic parameters for last 24 hours:    Intake/Output from previous day: 05/09 0701 - 05/10 0700 In: 1818.5 [I.V.:1123.5; NG/GT:595; IV Piggyback:100] Out: 2250 [Urine:2250]  Intake/Output this shift: Total I/O In: -  Out: 250 [Urine:250]  Vent settings for last 24 hours: Vent Mode: CPAP;PSV FiO2 (%):  [40 %-50 %] 40 % Set Rate:  [20 bmp] 20 bmp Vt Set:  [147 mL] 660 mL PEEP:  [5 cmH20] 5 cmH20 Pressure Support:  [10 cmH20-12 cmH20] 12 cmH20 Plateau Pressure:  [16 cmH20-19 cmH20] 16 cmH20  Physical Exam:  General: alert and no respiratory distress Neuro: alert, oriented, nonfocal exam and RASS 0 HEENT/Neck: no JVD and ETT WNL  Resp: clear to auscultation bilaterally CVS: regular rate and rhythm, S1, S2 normal, no murmur, click, rub or gallop GI: Midline wound open with NPWD, changed last on Wednesday.Marland Kitchen  Has  active bowel sounds. Extremities: no edema, no erythema, pulses WNL  Results for orders placed or performed during the hospital encounter of 08/27/17 (from the past 24 hour(s))  Glucose, capillary     Status: Abnormal   Collection Time: 08/31/17 12:30 PM  Result Value Ref Range   Glucose-Capillary 120 (H) 65 - 99 mg/dL  Glucose, capillary     Status: Abnormal   Collection Time: 08/31/17  4:42 PM  Result Value Ref Range   Glucose-Capillary 127 (H) 65 - 99 mg/dL  Magnesium     Status: None   Collection Time: 08/31/17  5:15 PM  Result Value Ref Range   Magnesium 2.4 1.7 - 2.4 mg/dL  Phosphorus     Status: Abnormal   Collection Time: 08/31/17  5:15 PM  Result Value Ref Range   Phosphorus 2.0 (L) 2.5 - 4.6 mg/dL  Glucose, capillary     Status: Abnormal   Collection Time: 08/31/17 11:56 PM  Result Value Ref Range   Glucose-Capillary 149 (H) 65 - 99 mg/dL  Glucose, capillary     Status: Abnormal   Collection Time: 09/01/17  3:59 AM  Result Value Ref Range   Glucose-Capillary 131 (H) 65 - 99 mg/dL  Magnesium     Status: Abnormal   Collection Time: 09/01/17  5:07 AM  Result Value Ref Range   Magnesium 2.5 (H) 1.7 - 2.4 mg/dL  Phosphorus     Status: None   Collection Time: 09/01/17  5:07 AM  Result Value Ref Range   Phosphorus 2.5 2.5 - 4.6 mg/dL  CBC     Status: Abnormal   Collection Time: 09/01/17  5:07 AM  Result Value Ref Range   WBC 11.9 (H) 4.0 - 10.5 K/uL   RBC 2.61 (L) 4.22 - 5.81 MIL/uL   Hemoglobin 7.8 (L) 13.0 - 17.0 g/dL   HCT 96.0 (L) 45.4 - 09.8 %   MCV 93.1 78.0 - 100.0 fL   MCH 29.9 26.0 - 34.0 pg   MCHC 32.1 30.0 - 36.0 g/dL   RDW 11.9 14.7 - 82.9 %   Platelets 284 150 - 400 K/uL  Glucose, capillary     Status: Abnormal   Collection Time: 09/01/17  8:24 AM  Result Value Ref Range   Glucose-Capillary 121 (H) 65 - 99 mg/dL     Assessment/Plan:   NEURO  Altered Mental Status:  agitation, sedation and but following commands and very interactive   Plan:  May be able to wean to extubate today.  PULM  Lots of secretions.  Sats only mid 90's with FIO2 50%.   Plan: Check ABG, diuresis  CARDIO  No specific issues   Plan: CPM  RENAL  Volume balanced, but overall phypervolemic   Plan: CPM  GI  Splenic Trauma with splencectomy   Plan: Getting tube feedings now.  ID  No known infectious source, spiked fever to 102.     Plan: Investigate possible infectious sources prior to starting antibiotics.  HEME  Anemia acute blood loss anemia)   Plan: No need for blood transfusion currently.  ENDO No known issues   Plan: CPM  Global Issues  Diuresis.  Possible extubation later today.  Check ABg.     LOS: 5 days   Additional comments:I reviewed the patient's new clinical lab test results. cbc/bmet  Critical Care Total Time*: 30 Minutes  Jimmye Norman 09/01/2017  *Care during the described time interval was provided by me and/or other providers on the critical care team.  I have reviewed this patient's available data, including medical history, events of note, physical examination and test results as part of my evaluation.

## 2017-09-02 ENCOUNTER — Inpatient Hospital Stay (HOSPITAL_COMMUNITY): Payer: No Typology Code available for payment source

## 2017-09-02 LAB — BASIC METABOLIC PANEL
Anion gap: 8 (ref 5–15)
BUN: 30 mg/dL — ABNORMAL HIGH (ref 6–20)
CHLORIDE: 116 mmol/L — AB (ref 101–111)
CO2: 29 mmol/L (ref 22–32)
CREATININE: 0.73 mg/dL (ref 0.61–1.24)
Calcium: 8.3 mg/dL — ABNORMAL LOW (ref 8.9–10.3)
GFR calc non Af Amer: >60 mL/min (ref >60)
GLUCOSE: 138 mg/dL — AB (ref 65–99)
Potassium: 3.7 mmol/L (ref 3.5–5.1)
Sodium: 153 mmol/L — ABNORMAL HIGH (ref 135–145)

## 2017-09-02 LAB — CBC WITH DIFFERENTIAL/PLATELET
BASOS ABS: 0 10*3/uL (ref 0.0–0.1)
Basophils Relative: 0 %
EOS PCT: 5 %
Eosinophils Absolute: 1 10*3/uL — ABNORMAL HIGH (ref 0.0–0.7)
HEMATOCRIT: 28.9 % — AB (ref 39.0–52.0)
HEMOGLOBIN: 9.1 g/dL — AB (ref 13.0–17.0)
LYMPHS ABS: 3 10*3/uL (ref 0.7–4.0)
LYMPHS PCT: 15 %
MCH: 29.9 pg (ref 26.0–34.0)
MCHC: 31.5 g/dL (ref 30.0–36.0)
MCV: 95.1 fL (ref 78.0–100.0)
MONOS PCT: 13 %
Monocytes Absolute: 2.6 10*3/uL — ABNORMAL HIGH (ref 0.1–1.0)
NEUTROS ABS: 13.4 10*3/uL — AB (ref 1.7–7.7)
Neutrophils Relative %: 67 %
Platelets: 417 10*3/uL — ABNORMAL HIGH (ref 150–400)
RBC: 3.04 MIL/uL — ABNORMAL LOW (ref 4.22–5.81)
RDW: 15.5 % (ref 11.5–15.5)
WBC: 20 10*3/uL — ABNORMAL HIGH (ref 4.0–10.5)

## 2017-09-02 LAB — GLUCOSE, CAPILLARY
GLUCOSE-CAPILLARY: 113 mg/dL — AB (ref 65–99)
GLUCOSE-CAPILLARY: 120 mg/dL — AB (ref 65–99)
GLUCOSE-CAPILLARY: 128 mg/dL — AB (ref 65–99)
Glucose-Capillary: 143 mg/dL — ABNORMAL HIGH (ref 65–99)
Glucose-Capillary: 129 mg/dL — ABNORMAL HIGH (ref 65–99)
Glucose-Capillary: 120 mg/dL — ABNORMAL HIGH (ref 65–99)

## 2017-09-02 LAB — POCT I-STAT 3, ART BLOOD GAS (G3+)
ACID-BASE EXCESS: 3 mmol/L — AB (ref 0.0–2.0)
Bicarbonate: 27.4 mmol/L (ref 20.0–28.0)
O2 Saturation: 96 %
PCO2 ART: 37.6 mmHg (ref 32.0–48.0)
PH ART: 7.47 — AB (ref 7.350–7.450)
TCO2: 28 mmol/L (ref 22–32)
pO2, Arterial: 74 mmHg — ABNORMAL LOW (ref 83.0–108.0)

## 2017-09-02 MED ORDER — ORAL CARE MOUTH RINSE
15.0000 mL | Freq: Two times a day (BID) | OROMUCOSAL | Status: DC
  Administered 2017-09-02 – 2017-09-08 (×12): 15 mL via OROMUCOSAL

## 2017-09-02 MED ORDER — PIPERACILLIN-TAZOBACTAM 3.375 G IVPB
3.3750 g | Freq: Three times a day (TID) | INTRAVENOUS | Status: DC
  Administered 2017-09-02 – 2017-09-04 (×6): 3.375 g via INTRAVENOUS
  Filled 2017-09-02 (×7): qty 50

## 2017-09-02 MED ORDER — OXYCODONE HCL 5 MG PO TABS
5.0000 mg | ORAL_TABLET | Freq: Four times a day (QID) | ORAL | Status: DC
  Administered 2017-09-03 – 2017-09-04 (×4): 5 mg via ORAL
  Filled 2017-09-02 (×4): qty 1

## 2017-09-02 MED ORDER — HYDROMORPHONE HCL 1 MG/ML IJ SOLN
1.0000 mg | INTRAMUSCULAR | Status: DC | PRN
  Administered 2017-09-02 – 2017-09-03 (×7): 1 mg via INTRAVENOUS
  Filled 2017-09-02 (×7): qty 1

## 2017-09-02 MED ORDER — KCL IN DEXTROSE-NACL 20-5-0.2 MEQ/L-%-% IV SOLN
INTRAVENOUS | Status: DC
  Administered 2017-09-02 – 2017-09-04 (×6): via INTRAVENOUS
  Filled 2017-09-02 (×6): qty 1000

## 2017-09-02 NOTE — Progress Notes (Signed)
Pharmacy Antibiotic Note  Alan Maddox is a 47 y.o. male admitted on 08/27/2017 with pneumonia.  Pharmacy has been consulted for zosyn dosing. Pt with Tmax of 101.1 and WBC is elevated at 20. SCr is WNL. Culture are pending.   Plan: Zosyn 3.375gm IV Q8H (4 hr inf) F/u renal fxn, C&S, clinical status   *Pharmacy will sign off as no further dose adjustments are anticipated. Thank you for the consult!  Height:  (188 cm) Weight: 257 lb 4.4 oz (116.7 kg) IBW/kg (Calculated) : 82.2  Temp (24hrs), Avg:100.3 F (37.9 C), Min:99.2 F (37.3 C), Max:101.1 F (38.4 C)  Recent Labs  Lab 08/27/17 1850 08/28/17 0028  08/29/17 0532 08/30/17 0509 08/31/17 0242 09/01/17 0507 09/02/17 0653  WBC  --  11.3*   < > 16.1* 12.4* 12.5* 11.9* 20.0*  CREATININE 1.00 0.91  --  0.87 0.75 0.73  --  0.73  LATICACIDVEN 7.16* 1.8  --   --   --   --   --   --    < > = values in this interval not displayed.    Estimated Creatinine Clearance: 155 mL/min (by C-G formula based on SCr of 0.73 mg/dL).  Alan Maddox   Alan Maddox, Alan Maddox 09/02/2017 9:49 AM

## 2017-09-02 NOTE — Procedures (Signed)
Extubation Procedure Note  Patient Details:   Name: Alan Maddox DOB: 1970/05/02 MRN: [id number]   Airway Documentation:  Airway 8 mm (Active)  Secured at (cm) 25 cm 09/02/2017  8:50 AM  Measured From Lips 09/02/2017  8:50 AM  Secured Location Right 09/02/2017  8:50 AM  Secured By [employer] 09/02/2017  8:50 AM  Tube Holder Repositioned Yes 09/02/2017  8:50 AM  Cuff Pressure (cm H2O) 26 cm H2O 09/01/2017  7:49 PM  Site Condition Dry 09/02/2017  8:50 AM   Vent end date: (not recorded) Vent end time: (not recorded)   Evaluation  O2 sats: stable throughout Complications: No apparent complications Patient did tolerate procedure well. Bilateral Breath Sounds: Diminished   Yes  Melanee Spry 09/02/2017, 11:48 AM
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
## 2017-09-03 ENCOUNTER — Inpatient Hospital Stay (HOSPITAL_COMMUNITY): Payer: No Typology Code available for payment source

## 2017-09-03 ENCOUNTER — Encounter (HOSPITAL_COMMUNITY): Payer: Self-pay | Admitting: Certified Registered Nurse Anesthetist

## 2017-09-03 LAB — GLUCOSE, CAPILLARY
GLUCOSE-CAPILLARY: 113 mg/dL — AB (ref 65–99)
GLUCOSE-CAPILLARY: 114 mg/dL — AB (ref 65–99)
Glucose-Capillary: 114 mg/dL — ABNORMAL HIGH (ref 65–99)
Glucose-Capillary: 119 mg/dL — ABNORMAL HIGH (ref 65–99)
Glucose-Capillary: 112 mg/dL — ABNORMAL HIGH (ref 65–99)

## 2017-09-03 MED ORDER — METHOCARBAMOL 500 MG PO TABS
750.0000 mg | ORAL_TABLET | Freq: Three times a day (TID) | ORAL | Status: DC | PRN
  Administered 2017-09-03 – 2017-09-04 (×3): 750 mg via ORAL
  Filled 2017-09-03 (×2): qty 2

## 2017-09-03 MED ORDER — HYDROMORPHONE HCL 1 MG/ML IJ SOLN
0.5000 mg | INTRAMUSCULAR | Status: DC | PRN
  Administered 2017-09-03 – 2017-09-04 (×14): 1 mg via INTRAVENOUS
  Filled 2017-09-03 (×14): qty 1

## 2017-09-03 MED ORDER — RESOURCE THICKENUP CLEAR PO POWD
Freq: Once | ORAL | Status: AC
  Administered 2017-09-03: 15:00:00 via ORAL
  Filled 2017-09-03: qty 125

## 2017-09-03 MED ORDER — BISACODYL 10 MG RE SUPP
10.0000 mg | Freq: Once | RECTAL | Status: AC
  Administered 2017-09-03: 10 mg via RECTAL
  Filled 2017-09-03: qty 1
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
## 2017-09-03 NOTE — Progress Notes (Signed)
Trauma Service Note  Subjective: Patient is a bit anxious.  No distress.  Wants to get some water  Objective: Vital signs in last 24 hours: Temp:  [98.2 F (36.8 C)-99.5 F (37.5 C)] 99.5 F (37.5 C) (05/12 0800) Pulse Rate:  [56-94] 72 (05/12 0700) Resp:  [16-37] 30 (05/12 0700) BP: (107-146)/(65-95) 140/85 (05/12 0700) SpO2:  [90 %-100 %] 90 % (05/12 0700) FiO2 (%):  [40 %] 40 % (05/11 1145) Weight:  [114.5 kg (252 lb 6.8 oz)] 114.5 kg (252 lb 6.8 oz) (05/12 0651) Last BM Date: (pta)  Intake/Output from previous day: 05/11 0701 - 05/12 0700 In: 2748.3 [I.V.:2428.3; NG/GT:70; IV Piggyback:250] Out: 2300 [Urine:2275; Drains:25] Intake/Output this shift: No intake/output data recorded.  General: Raspy voice.  No distress  Lungs: Rhonchorous on initial evaluation.  Clears with coughing.  Oxygen saturations 95% on 4L by Luther  Abd: Soft, good bowel sounds.  Only reason that he cannot eat is because he is aspirating per nursing staff.  Extremities: Edematous with some passive tenderness of the LEs.  Will get a vascular duplex study for potential DVT  Neuro: Confused.  Lab Results: CBC  Recent Labs    09/01/17 0507 09/02/17 0653  WBC 11.9* 20.0*  HGB 7.8* 9.1*  HCT 24.3* 28.9*  PLT 284 417*   BMET Recent Labs    09/02/17 0653  NA 153*  K 3.7  CL 116*  CO2 29  GLUCOSE 138*  BUN 30*  CREATININE 0.73  CALCIUM 8.3*   PT/INR No results for input(s): LABPROT, INR in the last 72 hours. ABG Recent Labs    09/01/17 1033 09/02/17 1034  PHART 7.447 7.470*  HCO3 25.5 27.4    Studies/Results: Dg Chest Port 1 View  Result Date: 09/02/2017 CLINICAL DATA:  Pneumonia EXAM: PORTABLE CHEST 1 VIEW COMPARISON:  Portable exam 1002 hours compared to 08/30/2017 FINDINGS: Tip of endotracheal tube projects 4.3 cm above carina. Nasogastric tube extends into stomach. Upper normal heart size. Mediastinal contours and pulmonary vascularity normal. Bibasilar atelectasis. Perihilar  infiltrate versus edema greater on LEFT. No pneumothorax. Bones demineralized. IMPRESSION: Bibasilar atelectasis with perihilar infiltrates greater on LEFT. When compared to previous exam, LEFT basilar atelectasis has improved and LEFT perihilar infiltrate has slightly increased. Electronically Signed   By: Ulyses Southward M.D.   On: 09/02/2017 12:04    Anti-infectives: Anti-infectives (From admission, onward)   Start     Dose/Rate Route Frequency Ordered Stop   09/02/17 1100  piperacillin-tazobactam (ZOSYN) IVPB 3.375 g     3.375 g 12.5 mL/hr over 240 Minutes Intravenous Every 8 hours 09/02/17 0949     08/29/17 1445  ceFAZolin (ANCEF) IVPB 2g/100 mL premix     2 g 200 mL/hr over 30 Minutes Intravenous Every 8 hours 08/29/17 1435 08/30/17 0643   08/29/17 0945  ceFAZolin (ANCEF) IVPB 1 g/50 mL premix     1 g 100 mL/hr over 30 Minutes Intravenous On call to O.R. 08/29/17 8295 08/30/17 0559      Assessment/Plan: s/p Procedure(s): INTRAMEDULLARY (IM) NAIL INTERTROCHANTRIC Swallowing evaluation.  OT/PT   LOS: 7 days   Marta Lamas. Gae Bon, MD, FACS [phone] Trauma Surgeon 09/03/2017
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
## 2017-09-03 NOTE — Progress Notes (Signed)
Pt continuously calling out for more pain medicine even though he has already been given everything ordered.  Pt is almost in tears he says he is hurting so badly.  Per report I was told that the patient was on a pretty high dose of methadone prior to arrival, but no one seems to know how much exactly.  I will call on call trauma and see if theres anything else that can be given.  Will continue to monitor.

## 2017-09-03 NOTE — Progress Notes (Signed)
I called pharmacy to ask if they possibly knew anything about the amount of methadone the pt was on prior to arrival.  I was informed that they are currently in the process of receiving this information from the clinic he had been going to.  Once they receive all of this info it might be a good idea to add this back into his medication regimen as we are having so much trouble keeping his pain level under control.

## 2017-09-03 NOTE — Progress Notes (Signed)
Modified Barium Swallow Progress Note  Patient Details  Name: Alan Maddox MRN: [id number] Date of Birth: Dec 21, 1970  Today's Date: 09/03/2017  Modified Barium Swallow completed.  Full report located under Chart Review in the Imaging Section.  Brief recommendations include the following:  Clinical Impression  Pt presents with a post-extubation dysphagia, with poor laryngeal closure that leads to aspiration of thin and nectar thick liquids before the swallow.  Liquids spill over arytenoids into posterior trachea, generally silently.  Given prolonged intubation period and aphonic voice, etiology is likely impaired glottal closure. Larger volumes elicit a weak, aphonic cough response. There was no residue post-swallow; piecemeal bolusing noted with solids.  Pt anxious during study, verbalizing that he was scared.  Provided with reassurance.    Given high levels of impulsivity, recommend starting conservative diet of dysphagia 2; honey-thick liquids from a spoon. Crush meds in puree.  Educated pt re: results/recommendations - currently with poor insight.  . Anticipate improvements correlating with improved phonation, however pt will need repeat instrumental swallow study prior to advancing diet.  D/W RN.  SLP will follow for safety, diet progression.   Swallow Evaluation Recommendations       SLP Diet Recommendations: Dysphagia 2 (Fine chop) solids;Honey thick liquids   Liquid Administration via: Spoon   Medication Administration: Crushed with puree   Supervision: Staff to assist with self feeding;Full supervision/cueing for compensatory strategies   Compensations: Minimize environmental distractions;Slow rate;Small sips/bites       Oral Care Recommendations: Oral care BID   Other Recommendations: Order thickener from pharmacy    Blenda Mounts [name] 09/03/2017,3:06 PM

## 2017-09-04 LAB — CBC WITH DIFFERENTIAL/PLATELET
Basophils Absolute: 0 10*3/uL (ref 0.0–0.1)
Basophils Relative: 0 %
EOS ABS: 1.2 10*3/uL — AB (ref 0.0–0.7)
EOS PCT: 5 %
HCT: 30.5 % — ABNORMAL LOW (ref 39.0–52.0)
Hemoglobin: 9.4 g/dL — ABNORMAL LOW (ref 13.0–17.0)
LYMPHS ABS: 4.2 10*3/uL — AB (ref 0.7–4.0)
Lymphocytes Relative: 18 %
MCH: 29.6 pg (ref 26.0–34.0)
MCHC: 30.8 g/dL (ref 30.0–36.0)
MCV: 95.9 fL (ref 78.0–100.0)
MONO ABS: 2.5 10*3/uL — AB (ref 0.1–1.0)
MONOS PCT: 11 %
NEUTROS PCT: 66 %
Neutro Abs: 15.2 10*3/uL — ABNORMAL HIGH (ref 1.7–7.7)
PLATELETS: 609 10*3/uL — AB (ref 150–400)
RBC: 3.18 MIL/uL — AB (ref 4.22–5.81)
RDW: 15.1 % (ref 11.5–15.5)
WBC: 23.1 10*3/uL — AB (ref 4.0–10.5)

## 2017-09-04 LAB — BASIC METABOLIC PANEL
ANION GAP: 7 (ref 5–15)
BUN: 20 mg/dL (ref 6–20)
CALCIUM: 8.3 mg/dL — AB (ref 8.9–10.3)
CO2: 27 mmol/L (ref 22–32)
Chloride: 111 mmol/L (ref 101–111)
Creatinine, Ser: 0.76 mg/dL (ref 0.61–1.24)
GFR calc Af Amer: >60 mL/min (ref >60)
GLUCOSE: 123 mg/dL — AB (ref 65–99)
Potassium: 3.9 mmol/L (ref 3.5–5.1)
SODIUM: 145 mmol/L (ref 135–145)

## 2017-09-04 LAB — GLUCOSE, CAPILLARY
GLUCOSE-CAPILLARY: 135 mg/dL — AB (ref 65–99)
Glucose-Capillary: 101 mg/dL — ABNORMAL HIGH (ref 65–99)
Glucose-Capillary: 109 mg/dL — ABNORMAL HIGH (ref 65–99)

## 2017-09-04 LAB — CULTURE, RESPIRATORY W GRAM STAIN: Culture: NORMAL

## 2017-09-04 LAB — CULTURE, RESPIRATORY: SPECIAL REQUESTS: NORMAL

## 2017-09-04 MED ORDER — OXYCODONE HCL 5 MG PO TABS
10.0000 mg | ORAL_TABLET | Freq: Four times a day (QID) | ORAL | Status: DC
  Administered 2017-09-04 – 2017-09-05 (×5): 10 mg via ORAL
  Filled 2017-09-04 (×5): qty 2

## 2017-09-04 MED ORDER — METHOCARBAMOL 500 MG PO TABS
750.0000 mg | ORAL_TABLET | Freq: Three times a day (TID) | ORAL | Status: DC
  Administered 2017-09-04 (×2): 750 mg via ORAL
  Filled 2017-09-04 (×3): qty 2

## 2017-09-04 MED ORDER — TRAMADOL HCL 50 MG PO TABS
100.0000 mg | ORAL_TABLET | Freq: Four times a day (QID) | ORAL | Status: DC
  Administered 2017-09-04 – 2017-09-09 (×19): 100 mg via ORAL
  Filled 2017-09-04 (×19): qty 2

## 2017-09-04 MED ORDER — HYDROMORPHONE HCL 1 MG/ML IJ SOLN
1.0000 mg | INTRAMUSCULAR | Status: DC | PRN
  Administered 2017-09-04 – 2017-09-06 (×28): 2 mg via INTRAVENOUS
  Administered 2017-09-06: 1 mg via INTRAVENOUS
  Administered 2017-09-06 (×2): 2 mg via INTRAVENOUS
  Administered 2017-09-06 (×2): 1 mg via INTRAVENOUS
  Administered 2017-09-07 (×6): 2 mg via INTRAVENOUS
  Filled 2017-09-04 (×16): qty 2
  Filled 2017-09-04: qty 1
  Filled 2017-09-04 (×15): qty 2
  Filled 2017-09-04: qty 1
  Filled 2017-09-04 (×6): qty 2

## 2017-09-04 MED ORDER — FAMOTIDINE 20 MG PO TABS
20.0000 mg | ORAL_TABLET | Freq: Two times a day (BID) | ORAL | Status: DC
  Administered 2017-09-04 – 2017-09-08 (×8): 20 mg via ORAL
  Filled 2017-09-04 (×8): qty 1

## 2017-09-04 MED ORDER — SIMETHICONE 80 MG PO CHEW
80.0000 mg | CHEWABLE_TABLET | Freq: Once | ORAL | Status: AC
  Administered 2017-09-04: 80 mg via ORAL
  Filled 2017-09-04: qty 1

## 2017-09-04 MED ORDER — QUETIAPINE FUMARATE 100 MG PO TABS
200.0000 mg | ORAL_TABLET | Freq: Every day | ORAL | Status: DC
  Administered 2017-09-05 – 2017-09-08 (×3): 200 mg via ORAL
  Filled 2017-09-04 (×4): qty 2

## 2017-09-04 MED ORDER — ACETAMINOPHEN 500 MG PO TABS
1000.0000 mg | ORAL_TABLET | Freq: Three times a day (TID) | ORAL | Status: DC
  Administered 2017-09-04 – 2017-09-13 (×27): 1000 mg via ORAL
  Filled 2017-09-04 (×28): qty 2

## 2017-09-04 MED ORDER — TAMSULOSIN HCL 0.4 MG PO CAPS
0.4000 mg | ORAL_CAPSULE | Freq: Every day | ORAL | Status: DC
  Administered 2017-09-04 – 2017-09-13 (×10): 0.4 mg via ORAL
  Filled 2017-09-04 (×10): qty 1
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
## 2017-09-04 NOTE — Plan of Care (Signed)
Patient had modified barium swallow study yesterday and was given a dysphagia 3 diet with honey thick liquid.  Patient eating and drinking, will continue to monitor.  Heloise Purpura RN

## 2017-09-04 NOTE — Progress Notes (Signed)
R upper and lower Leg dressing changed and wounds cleaned per order with telfa nonstick dressing gauze and kerlix on lower R leg. Upper R leg cleansed dx changed with telfa and 2 foam dx. L hip dressing is c/d/i,
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
## 2017-09-04 NOTE — Progress Notes (Signed)
Spoke with Shireen Quan at [facility] to attempt to verify patient's methadone dose. He refused to provide information and hung up on me. Will continue to try to verify patient's methadone dose
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
## 2017-09-05 ENCOUNTER — Other Ambulatory Visit: Payer: Self-pay

## 2017-09-05 DIAGNOSIS — S3609XA Other injury of spleen, initial encounter: Principal | ICD-10-CM

## 2017-09-05 LAB — CBC WITH DIFFERENTIAL/PLATELET
BASOS ABS: 0 10*3/uL (ref 0.0–0.1)
Basophils Relative: 0 %
EOS PCT: 4 %
Eosinophils Absolute: 1 10*3/uL — ABNORMAL HIGH (ref 0.0–0.7)
HCT: 32.2 % — ABNORMAL LOW (ref 39.0–52.0)
Hemoglobin: 10.2 g/dL — ABNORMAL LOW (ref 13.0–17.0)
LYMPHS ABS: 3.1 10*3/uL (ref 0.7–4.0)
Lymphocytes Relative: 12 %
MCH: 30.7 pg (ref 26.0–34.0)
MCHC: 31.7 g/dL (ref 30.0–36.0)
MCV: 97 fL (ref 78.0–100.0)
MONO ABS: 2.1 10*3/uL — AB (ref 0.1–1.0)
Monocytes Relative: 8 %
Neutro Abs: 19.7 10*3/uL — ABNORMAL HIGH (ref 1.7–7.7)
Neutrophils Relative %: 76 %
PLATELETS: 714 10*3/uL — AB (ref 150–400)
RBC: 3.32 MIL/uL — AB (ref 4.22–5.81)
RDW: 15.7 % — AB (ref 11.5–15.5)
WBC: 25.9 10*3/uL — AB (ref 4.0–10.5)

## 2017-09-05 LAB — BASIC METABOLIC PANEL
Anion gap: 7 (ref 5–15)
BUN: 17 mg/dL (ref 6–20)
CO2: 27 mmol/L (ref 22–32)
Calcium: 8.5 mg/dL — ABNORMAL LOW (ref 8.9–10.3)
Chloride: 106 mmol/L (ref 101–111)
Creatinine, Ser: 0.63 mg/dL (ref 0.61–1.24)
GFR calc Af Amer: >60 mL/min (ref >60)
GLUCOSE: 135 mg/dL — AB (ref 65–99)
POTASSIUM: 4 mmol/L (ref 3.5–5.1)
Sodium: 140 mmol/L (ref 135–145)

## 2017-09-05 MED ORDER — CALCIUM CARBONATE ANTACID 500 MG PO CHEW
1.0000 | CHEWABLE_TABLET | Freq: Three times a day (TID) | ORAL | Status: DC | PRN
  Administered 2017-09-05 – 2017-09-12 (×5): 200 mg via ORAL
  Filled 2017-09-05 (×7): qty 1

## 2017-09-05 MED ORDER — OXYCODONE HCL 5 MG PO TABS
10.0000 mg | ORAL_TABLET | Freq: Four times a day (QID) | ORAL | Status: DC | PRN
Start: 2017-09-05 — End: 2017-09-07
  Administered 2017-09-05: 10 mg via ORAL
  Administered 2017-09-06 (×2): 15 mg via ORAL
  Filled 2017-09-05 (×2): qty 3
  Filled 2017-09-05: qty 2

## 2017-09-05 MED ORDER — METHOCARBAMOL 500 MG PO TABS
1000.0000 mg | ORAL_TABLET | Freq: Three times a day (TID) | ORAL | Status: DC
  Administered 2017-09-05 – 2017-09-13 (×25): 1000 mg via ORAL
  Filled 2017-09-05 (×25): qty 2

## 2017-09-05 MED ORDER — METHOCARBAMOL 500 MG PO TABS: 1000.0000 mg | ORAL_TABLET | Freq: Three times a day (TID) | ORAL | Status: DC | PRN
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
## 2017-09-06 MED ORDER — COLLAGENASE 250 UNIT/GM EX OINT
TOPICAL_OINTMENT | Freq: Every day | CUTANEOUS | Status: DC
  Administered 2017-09-06 – 2017-09-07 (×2): via TOPICAL
  Administered 2017-09-08 – 2017-09-10 (×3): 1 via TOPICAL
  Administered 2017-09-11 – 2017-09-13 (×3): via TOPICAL
  Filled 2017-09-06: qty 30
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
## 2017-09-07 ENCOUNTER — Encounter (HOSPITAL_COMMUNITY): Payer: Self-pay | Admitting: *Deleted

## 2017-09-07 ENCOUNTER — Inpatient Hospital Stay (HOSPITAL_COMMUNITY): Payer: No Typology Code available for payment source

## 2017-09-07 MED ORDER — OXYCODONE HCL 5 MG PO TABS
10.0000 mg | ORAL_TABLET | ORAL | Status: DC | PRN
  Administered 2017-09-07 – 2017-09-09 (×8): 15 mg via ORAL
  Filled 2017-09-07 (×8): qty 3

## 2017-09-07 MED ORDER — BISACODYL 10 MG RE SUPP
10.0000 mg | Freq: Every day | RECTAL | Status: DC | PRN
  Administered 2017-09-07: 10 mg via RECTAL
  Filled 2017-09-07: qty 1

## 2017-09-07 MED ORDER — PNEUMOCOCCAL 13-VAL CONJ VACC IM SUSP
0.5000 mL | Freq: Once | INTRAMUSCULAR | Status: AC
  Administered 2017-09-07: 0.5 mL via INTRAMUSCULAR
  Filled 2017-09-07: qty 0.5

## 2017-09-07 MED ORDER — MENINGOCOCCAL A C Y&W-135 OLIG IM SOLR
0.5000 mL | Freq: Once | INTRAMUSCULAR | Status: AC
  Administered 2017-09-07: 0.5 mL via INTRAMUSCULAR
  Filled 2017-09-07: qty 0.5

## 2017-09-07 MED ORDER — HAEMOPHILUS B POLYSAC CONJ VAC IM SOLR
0.5000 mL | Freq: Once | INTRAMUSCULAR | Status: AC
  Administered 2017-09-07: 0.5 mL via INTRAMUSCULAR
  Filled 2017-09-07: qty 0.5

## 2017-09-07 MED ORDER — HYDROMORPHONE HCL 1 MG/ML IJ SOLN
1.0000 mg | Freq: Four times a day (QID) | INTRAMUSCULAR | Status: DC | PRN
  Administered 2017-09-07 – 2017-09-09 (×6): 1 mg via INTRAVENOUS
  Filled 2017-09-07 (×6): qty 1
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
## 2017-09-07 NOTE — Progress Notes (Addendum)
Modified Barium Swallow Progress Note  Patient Details  Name: Alan Maddox MRN: [id number] Date of Birth: Oct 12, 1970  Today's Date: 09/07/2017  Modified Barium Swallow completed.  Full report located under Chart Review in the Imaging Section.  Brief recommendations include the following:  Clinical Impression  Pt demonstrates resolution of dysphagia, imaging reveals consistent, complete airway protection throught study regarless of texutre, bolus size or rate. Pt may resume regular diet and thin liquids. No SLP f/u needed for swallowing.     Swallow Evaluation Recommendations       SLP Diet Recommendations: Regular solids;Thin liquid   Liquid Administration via: Cup;Straw   Medication Administration: Whole meds with liquid   Supervision: Patient able to self feed   Compensations: Slow rate;Small sips/bites   Postural Changes: Seated upright at 90 degrees            Hilde Churchman, [employer] 09/07/2017,10:09 AM
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
## 2017-09-08 MED ORDER — FENTANYL 25 MCG/HR TD PT72
50.0000 ug | MEDICATED_PATCH | TRANSDERMAL | Status: DC
  Administered 2017-09-08 – 2017-09-11 (×2): 50 ug via TRANSDERMAL
  Filled 2017-09-08 (×2): qty 2

## 2017-09-08 NOTE — Progress Notes (Signed)
Upon entering pt's room, pt yelled out, " I need Dilaudid now!" Writer explained the changes made to medication on 09/07/17. Pt was not pleased. He requested for RN to give him Morphine and call the doctor to up his IV pain medication. RN explained this was not order; pt stated he will need to call the "dope man". He continue to requested pain medication; prn Oxycodone  given.
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
## 2017-09-09 MED ORDER — QUETIAPINE FUMARATE 100 MG PO TABS
100.0000 mg | ORAL_TABLET | Freq: Every day | ORAL | Status: DC
  Administered 2017-09-09: 100 mg via ORAL
  Filled 2017-09-09: qty 1

## 2017-09-09 MED ORDER — DOCUSATE SODIUM 100 MG PO CAPS
100.0000 mg | ORAL_CAPSULE | Freq: Two times a day (BID) | ORAL | Status: DC
  Administered 2017-09-09 – 2017-09-13 (×9): 100 mg via ORAL
  Filled 2017-09-09 (×9): qty 1

## 2017-09-09 MED ORDER — METHADONE HCL 10 MG PO TABS: 20.0000 mg | ORAL_TABLET | Freq: Four times a day (QID) | ORAL | Status: DC

## 2017-09-09 MED ORDER — METHADONE HCL 10 MG PO TABS
80.0000 mg | ORAL_TABLET | Freq: Every day | ORAL | Status: DC
  Administered 2017-09-09 – 2017-09-13 (×5): 80 mg via ORAL
  Filled 2017-09-09 (×5): qty 8

## 2017-09-09 MED ORDER — OXYCODONE HCL 5 MG PO TABS
5.0000 mg | ORAL_TABLET | ORAL | Status: DC | PRN
  Administered 2017-09-09 – 2017-09-13 (×13): 5 mg via ORAL
  Filled 2017-09-09 (×14): qty 1

## 2017-09-09 MED ORDER — NALOXONE HCL 0.4 MG/ML IJ SOLN: 0.4000 mg | INTRAMUSCULAR | Status: DC | PRN

## 2017-09-09 NOTE — Progress Notes (Signed)
2000: RN entered room pt requested IV dilaudid stating he was "gonna die" if he doesn't get dilaudid.  2030: RN explained that he had just received dilaudid at 1919 and was not due again until 0119. Pt very unhappy saying his pain was "out of control." Heating packs placed on pt's back.  2100: pt requested to speak to nurse and said that he wanted dilaudid-oxycodone given as well as scheduled meds.  1610- pt paged RN and requested dilaudid. Pt yelling in room that he was "in too much pain." and "could not take it"  600: Pt found resting and sleeping at 6am, RN went to give pt scheduled meds. Pt awoke and started moaning saying "oh the pain everything is in pain, when can I get the dilaudid?" PRN oxycodone 15 mg given.

## 2017-09-09 NOTE — Progress Notes (Signed)
1900: Handoff report received from RN. Pt resting in chair. Discussed plan of care for the shift; pt amenable to plan.  2000: Pt requesting assistance returning to bed; pt readied to transfer when he requests pain medication for pain 10/10. Pt specifically asked for "normal pain medication, nothing IV and not tylenol." PRN oxycodone administered. Pt endorsed transfer to bed was not as bad as he anticipated.  2200: HS meds administered and pt c/o loneliness, still with 10/10 pain. Pt refused repositioning, heat, and cold. Pt verbal and tearful about not feeling wanted by family; emotional support provided and spiritual consult requested.  0000: Pt resting comfortably.  0400: Pt awoken suddenly c/o 10/10 chest pain central in location, pressure without radiation, and a new headache 9/10. EKG performed. I have heard the pt c/o CP on a previous occasion while helping with his care, but he states this is the worst it's been during this hospital stay. HR 91 BP 121/73 (87) RR 19 SpO2 95% Pt thinks this is r/t restarting methadone. Janee Morn, MD (Trauma) aware; ordered 0600 Tylenol to be given now.,  0700: Handoff report given to RN.
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
## 2017-09-10 ENCOUNTER — Encounter (HOSPITAL_COMMUNITY): Payer: Self-pay

## 2017-09-10 DIAGNOSIS — S06339A Contusion and laceration of cerebrum, unspecified, with loss of consciousness of unspecified duration, initial encounter: Secondary | ICD-10-CM

## 2017-09-10 DIAGNOSIS — S72142A Displaced intertrochanteric fracture of left femur, initial encounter for closed fracture: Secondary | ICD-10-CM

## 2017-09-10 DIAGNOSIS — S0633AA Contusion and laceration of cerebrum, unspecified, with loss of consciousness status unknown, initial encounter: Secondary | ICD-10-CM

## 2017-09-10 MED ORDER — QUETIAPINE FUMARATE 50 MG PO TABS
50.0000 mg | ORAL_TABLET | Freq: Every day | ORAL | Status: DC
  Administered 2017-09-10 – 2017-09-12 (×3): 50 mg via ORAL
  Filled 2017-09-10 (×3): qty 1

## 2017-09-10 NOTE — Progress Notes (Signed)
CSW completed FL2/ PASRR and sent information to SNF's. CSW will continue to update.   Stacy Gardner, [facility] Emergency Room Clinical Social Worker [phone]
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
## 2017-09-11 MED ORDER — BISACODYL 10 MG RE SUPP
10.0000 mg | Freq: Two times a day (BID) | RECTAL | Status: DC | PRN
  Administered 2017-09-11: 10 mg via RECTAL
  Filled 2017-09-11: qty 1

## 2017-09-11 MED ORDER — MAGNESIUM CITRATE PO SOLN
1.0000 | Freq: Once | ORAL | Status: AC
  Administered 2017-09-11: 1 via ORAL
  Filled 2017-09-11: qty 296

## 2017-09-11 MED ORDER — ENSURE ENLIVE PO LIQD
237.0000 mL | Freq: Every day | ORAL | Status: DC
  Administered 2017-09-11 – 2017-09-12 (×2): 237 mL via ORAL
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
## 2017-09-11 NOTE — Progress Notes (Signed)
Went top patient room to assist back to bed at this time per therapy stated he would want back in at this time.  Patient refused to get back in bed at this time.  Informed to call when he was ready.

## 2017-09-11 NOTE — Clinical Social Work Note (Signed)
Clinical Social Worker continuing to follow patient and family for support and discharge planning needs.  Patient agreeable with SNF placement, however hopeful for a facility closer to home in [place].  Patient has been placed on the Difficult to Place list as he now requires Methadone at /daily for pain control.  CSW updated supervisor and will follow up with any potential bed offers once available.  CSW remains available for support and to facilitate patient discharge needs once medically stable.  Macario Golds, [place] [phone]
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
## 2017-09-11 NOTE — Plan of Care (Signed)
  Problem: Education: Goal: Knowledge of the prescribed therapeutic regimen Outcome: Progressing Goal: Knowledge of disease or condition will improve Outcome: Progressing   Problem: Clinical Measurements: Goal: Neurologic status will improve Outcome: Progressing   Problem: Tissue Perfusion: Goal: Ability to maintain intracranial pressure will improve Outcome: Progressing   Problem: Respiratory: Goal: Ability to maintain adequate ventilation will improve Outcome: Progressing   Problem: Skin Integrity: Goal: Risk for impaired skin integrity will decrease Outcome: Progressing Goal: Demonstration of wound healing without infection will improve Outcome: Progressing   Problem: Psychosocial: Goal: Ability to verbalize positive feelings about self will improve Outcome: Progressing Goal: Ability to participate in self-care as condition permits will improve Outcome: Progressing Goal: Ability to identify appropriate support needs will improve Outcome: Progressing   Problem: Health Behavior/Discharge Planning: Goal: Ability to manage health-related needs will improve Outcome: Progressing   Problem: Nutritional: Goal: Risk of aspiration will decrease Outcome: Progressing Goal: Dietary intake will improve Outcome: Progressing   Problem: Communication: Goal: Ability to communicate needs accurately will improve Outcome: Progressing   Problem: Education: Goal: Knowledge of General Education information will improve Outcome: Progressing   Problem: Health Behavior/Discharge Planning: Goal: Ability to manage health-related needs will improve Outcome: Progressing   Problem: Clinical Measurements: Goal: Ability to maintain clinical measurements within normal limits will improve Outcome: Progressing Goal: Will remain free from infection Outcome: Progressing Goal: Diagnostic test results will improve Outcome: Progressing Goal: Respiratory complications will improve Outcome:  Progressing Goal: Cardiovascular complication will be avoided Outcome: Progressing   Problem: Activity: Goal: Risk for activity intolerance will decrease Outcome: Progressing   Problem: Nutrition: Goal: Adequate nutrition will be maintained Outcome: Progressing   Problem: Coping: Goal: Level of anxiety will decrease Outcome: Progressing   Problem: Elimination: Goal: Will not experience complications related to bowel motility Outcome: Progressing Goal: Will not experience complications related to urinary retention Outcome: Progressing   Problem: Pain Managment: Goal: General experience of comfort will improve Outcome: Progressing   Problem: Safety: Goal: Ability to remain free from injury will improve Outcome: Progressing   Problem: Skin Integrity: Goal: Risk for impaired skin integrity will decrease Outcome: Progressing
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
## 2017-09-11 NOTE — Progress Notes (Signed)
Chaplain checked with PT during rounding.  PT was compliant and talking.  At times in the conversation PT was tearful because he does not know why good favor is not following him.  He has to come to terms with his past in order to understand where he is today.  We did not come to resolution in the conversation.
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
## 2017-09-12 NOTE — Clinical Social Work Placement (Signed)
   CLINICAL SOCIAL WORK PLACEMENT  NOTE  Date:  09/12/2017  Patient Details  Name: Alan Maddox MRN: [id number] Date of Birth: 10/04/1970  Clinical Social Work is seeking post-discharge placement for this patient at the Skilled  Nursing Facility level of care (*CSW will initial, date and re-position this form in  chart as items are completed):  Yes   Patient/family provided with [place] Clinical Social Work Department's list of facilities offering this level of care within the geographic area requested by the patient (or if unable, by the patient's family).  Yes   Patient/family informed of their freedom to choose among providers that offer the needed level of care, that participate in Medicare, Medicaid or managed care program needed by the patient, have an available bed and are willing to accept the patient.  Yes   Patient/family informed of [facility] ownership interest in [facility] and [facility], as well as of the fact that they are under no obligation to receive care at these facilities.  PASRR submitted to EDS on 09/11/17     PASRR number received on 09/11/17     Existing PASRR number confirmed on       FL2 transmitted to all facilities in geographic area requested by pt/family on 09/12/17     FL2 transmitted to all facilities within larger geographic area on       Patient informed that his/her managed care company has contracts with or will negotiate with certain facilities, including the following:        Yes   Patient/family informed of bed offers received.  Patient chooses bed at [facility]     Physician recommends and patient chooses bed at      Patient to be transferred to [facility] on 09/13/17.  Patient to be transferred to facility by Ambulance     Patient family notified on 09/12/17 of transfer.  Name of family member notified:  Patient to update family     PHYSICIAN Please prepare priority  discharge summary, including medications     Additional Comment:   Macario Golds, LCSW [phone]
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
## 2017-09-12 NOTE — Clinical Social Work Note (Signed)
Clinical Social Worker initiated bed search to [facility] in [place] and [facility] of [place] - patient has been offered a LOG bed by [facility].  CSW updated patient and PA regarding plans for discharge tomorrow.  Patient plans to communicate plans with family and friends tonight.  CSW remains available for support and to facilitate patient discharge needs.  Macario Golds, [place] [phone]
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
## 2017-09-13 MED ORDER — QUETIAPINE FUMARATE 50 MG PO TABS: 50.0000 mg | ORAL_TABLET | Freq: Every day | ORAL | Status: AC

## 2017-09-13 MED ORDER — ENOXAPARIN SODIUM 40 MG/0.4ML ~~LOC~~ SOLN: 40.0000 mg | SUBCUTANEOUS | 0 refills | Status: AC

## 2017-09-13 MED ORDER — ACETAMINOPHEN 500 MG PO TABS: 1000.0000 mg | ORAL_TABLET | Freq: Three times a day (TID) | ORAL | 0 refills | Status: AC

## 2017-09-13 MED ORDER — TAMSULOSIN HCL 0.4 MG PO CAPS: 0.4000 mg | ORAL_CAPSULE | Freq: Every day | ORAL | Status: AC

## 2017-09-13 MED ORDER — METHOCARBAMOL 500 MG PO TABS: 1000.0000 mg | ORAL_TABLET | Freq: Three times a day (TID) | ORAL | Status: AC

## 2017-09-13 MED ORDER — CALCIUM CARBONATE ANTACID 500 MG PO CHEW: 1.0000 | CHEWABLE_TABLET | Freq: Three times a day (TID) | ORAL | Status: AC | PRN

## 2017-09-13 MED ORDER — DOCUSATE SODIUM 100 MG PO CAPS: 100.0000 mg | ORAL_CAPSULE | Freq: Two times a day (BID) | ORAL | 0 refills | Status: AC

## 2017-09-13 MED ORDER — OXYCODONE HCL 5 MG PO TABS: 5.0000 mg | ORAL_TABLET | Freq: Four times a day (QID) | ORAL | 0 refills | Status: AC | PRN

## 2017-09-13 MED ORDER — MUPIROCIN 2 % EX OINT: TOPICAL_OINTMENT | Freq: Every day | CUTANEOUS | 0 refills | Status: AC

## 2017-09-13 MED ORDER — FENTANYL 50 MCG/HR TD PT72: 50.0000 ug | MEDICATED_PATCH | TRANSDERMAL | 0 refills | Status: AC

## 2017-09-13 MED ORDER — METHADONE HCL 10 MG PO TABS: 80.0000 mg | ORAL_TABLET | Freq: Every day | ORAL | 0 refills | Status: AC

## 2017-09-13 MED ORDER — ENSURE ENLIVE PO LIQD: 237.0000 mL | Freq: Every day | ORAL | 12 refills | Status: AC

## 2017-09-13 NOTE — Clinical Social Work Note (Signed)
Clinical Social Worker facilitated patient discharge including contacting patient and facility to confirm patient discharge plans.  Clinical information faxed to facility and patient agreeable with plan.  CSW arranged ambulance transport via [facility] to [facility].  RN to call report prior to discharge.  Clinical Social Worker will sign off for now as social work intervention is no longer needed. Please consult [place] again if new need arises.  Macario Golds, [place] [phone]

## 2017-09-13 NOTE — Discharge Instructions (Addendum)
MIDLINE WOUND CARE: - midline dressing to be changed twice daily - supplies: sterile saline, kerlix, scissors, ABD pads, tape  - remove dressing and all packing carefully, moistening with sterile saline as needed to avoid packing/internal dressing sticking to the wound. - clean edges of skin around the wound with water/gauze, making sure there is no tape debris or leakage left on skin that could cause skin irritation or breakdown. - dampen and clean kerlix with sterile saline and pack wound from wound base to skin level, making sure to take note of any possible areas of wound tracking, tunneling and packing appropriately. Wound can be packed loosely. Trim kerlix to size if a whole kerlix is not required. - cover wound with a dry ABD pad and secure with tape.  - write the date/time on the dry dressing/tape to better track when the last dressing change occurred. - apply any skin protectant/powder recommended by clinician to protect skin/skin folds. - change dressing as needed if leakage occurs, wound gets contaminated, or patient requests to shower. - patient may shower daily with wound open and following the shower the wound should be dried and a clean dressing placed.   **Recommend continued sinus precautions to include no nose blowing and open-mouth sneezes for 3 wks. Saline nasal spray prn congestion  **Touch down weight bearing to left lower extremity, with no range of motion restrictions

## 2017-10-09 ENCOUNTER — Encounter (HOSPITAL_COMMUNITY): Payer: Self-pay | Admitting: *Deleted

## 2017-10-09 ENCOUNTER — Emergency Department (HOSPITAL_COMMUNITY)
Admission: EM | Admit: 2017-10-09 | Discharge: 2017-10-09 | Disposition: A | Discharge status: Home / Self Care | Payer: Medicaid - Out of State | Attending: Emergency Medicine | Admitting: Emergency Medicine

## 2017-10-09 DIAGNOSIS — F17210 Nicotine dependence, cigarettes, uncomplicated: Secondary | ICD-10-CM | POA: Diagnosis not present

## 2017-10-09 DIAGNOSIS — Z79899 Other long term (current) drug therapy: Secondary | ICD-10-CM | POA: Diagnosis not present

## 2017-10-09 DIAGNOSIS — Z760 Encounter for issue of repeat prescription: Secondary | ICD-10-CM | POA: Diagnosis present

## 2017-10-09 NOTE — ED Notes (Signed)
ED Provider at bedside. 

## 2017-10-09 NOTE — Care Management (Addendum)
ED CM received handoff concerning medication refill for Methadone. Discussed with Dr. Ashok Cordia EDP he is unable to refill this prescription as it is a controlled substance. Patient will need to follow up with previous Methadone Clinic to continued receiving maintenance doses of  medication.  CM met with patient in the ED to discuss the option for care transitional plan. Patient states, he will need to be there at 6am tomorrow morning the only intake day available to be reinstated  [facility] [place] Phone: [phone].  CM attempted to contact the clinic business hours 6a- 12noon Mon-Fri. Patient is agreeable with plan verbalized understanding teach back done. Updated Dr. Ashok Cordia who is in agreement with transitional care plan. CM called to  arrange transportation back to [facility] in [place] and also to arrange transportation to [facility] for 5am. CM contacted the SNF [phone] Updated [place] Nurse on duty. Patient is discharged from the [facility] ED and will wait in lobby for transportation. Patient made aware that transport is waiting for another driver to come on duty, patient verbalized understanding patient given a  [place] sandwich and a drink. No further ED CM needs identified.

## 2017-10-09 NOTE — Discharge Instructions (Addendum)
It was our pleasure to provide your ER care today - we hope that you feel better.   Unfortunately, we as ED physicians cannot write prescriptions for methadone for treatment of opioid dependence.   At your facility, your physicians may use other medication's for treatment, and/or they can make arrangements to transport you to your methadone facility.
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
## 2019-11-29 IMAGING — RF DG SWALLOWING FUNCTION - NRPT [facility]
1 series · 18 of 24 positions shown · non-contrast
Comparison: none

[Series 1: run · 15 acquisitions, 18 frames shown]
[im 1/15]
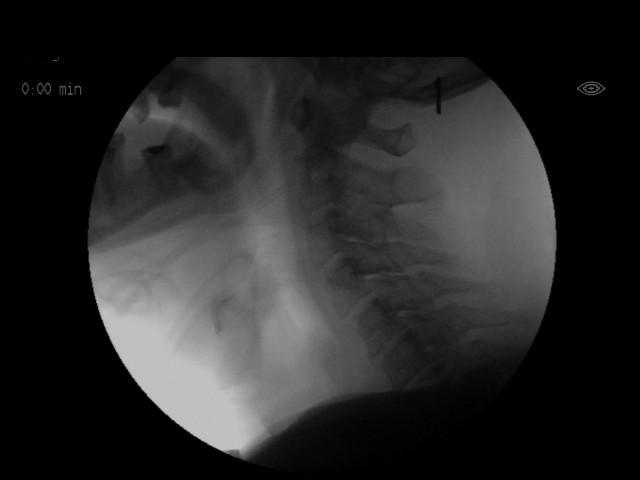
[im 2/15]
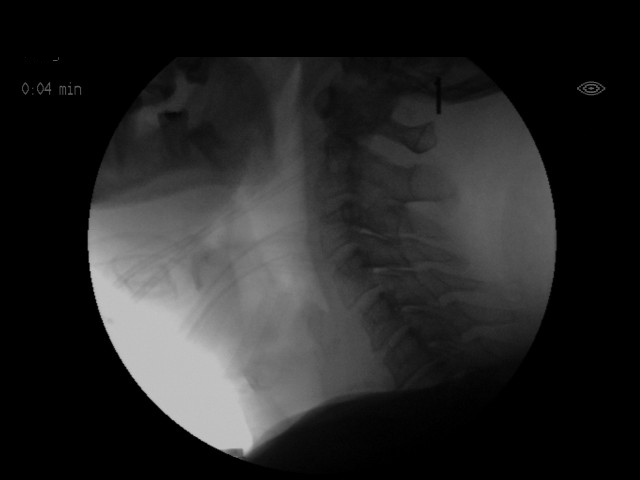
[im 3/15]
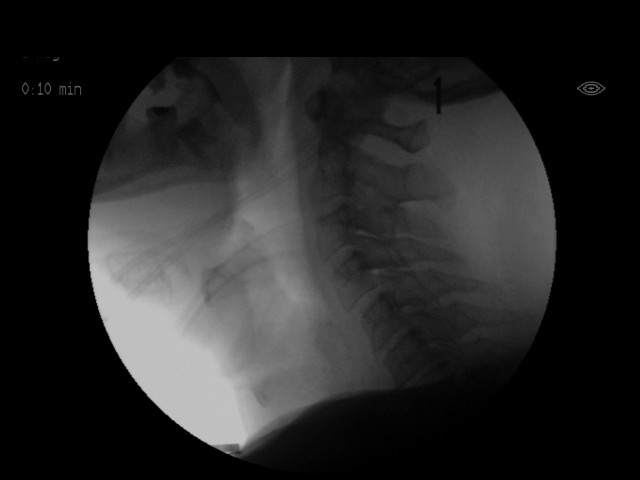
[im 3/15]
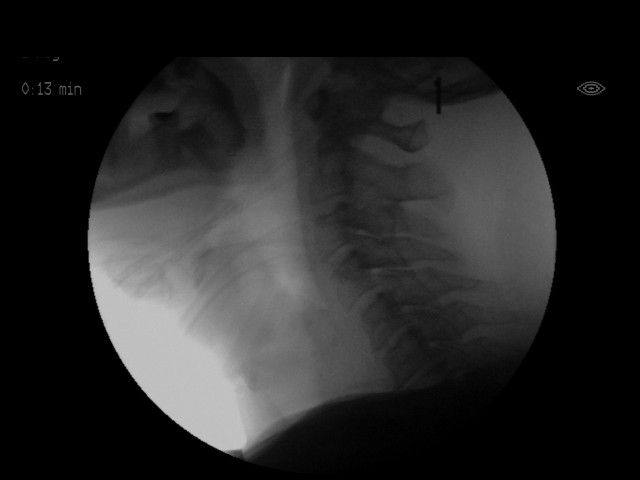
[im 4/15]
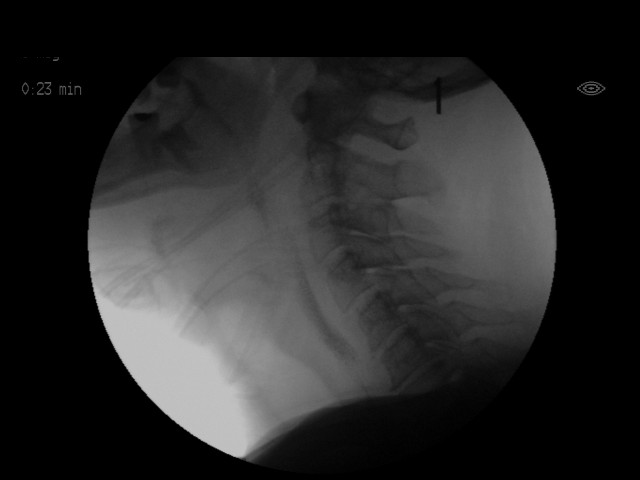
[im 5/15]
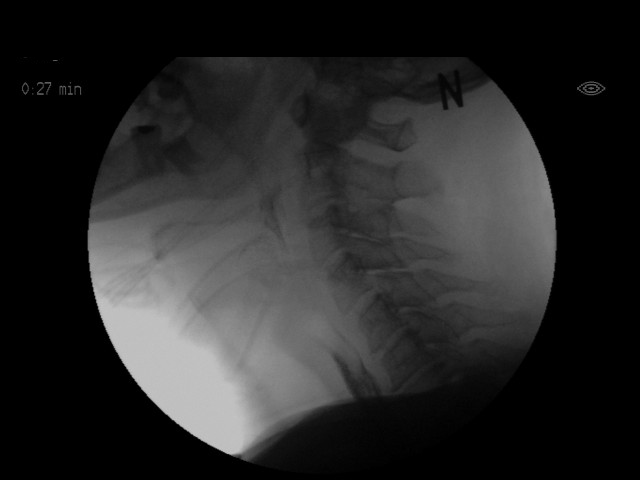
[im 6/15]
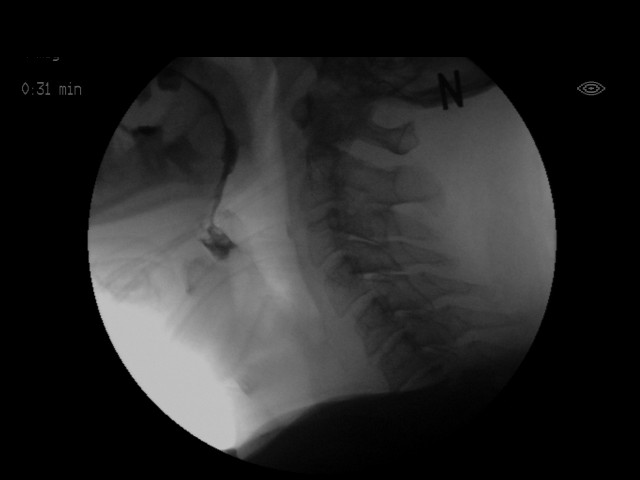
[im 7/15]
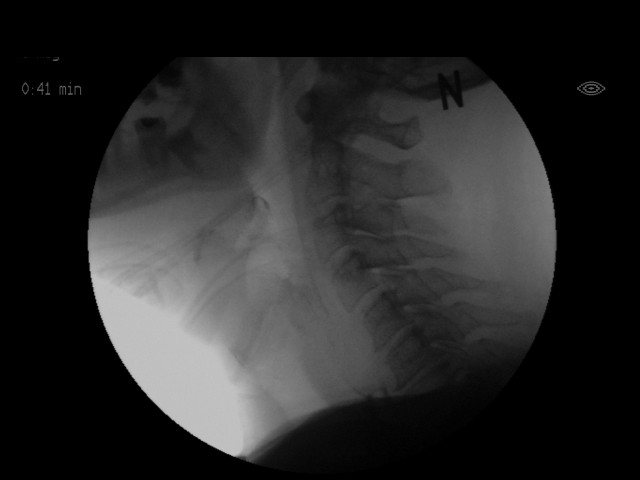
[im 8/15]
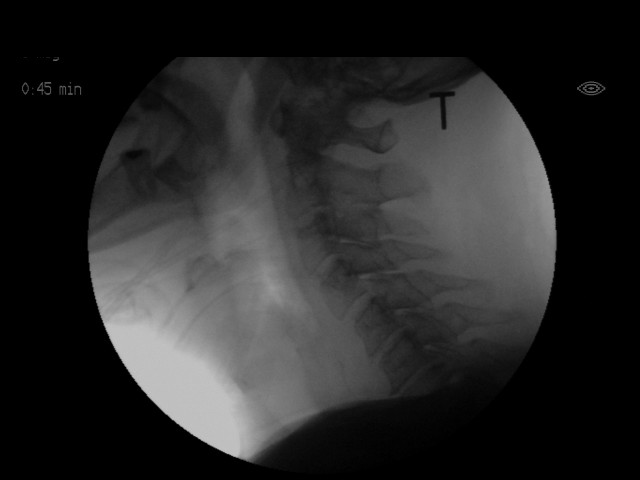
[im 8/15]
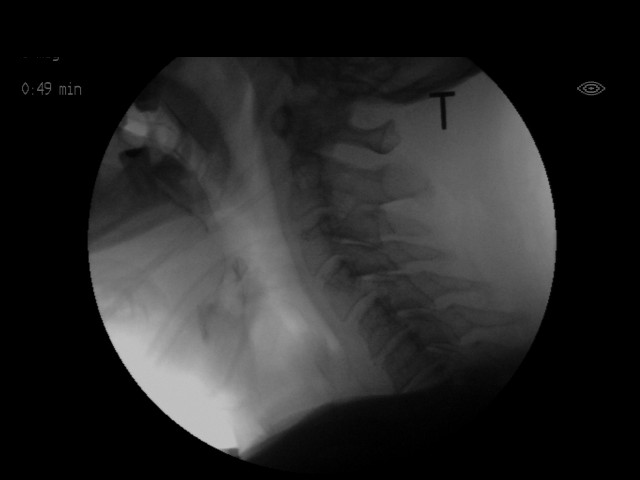
[im 10/15]
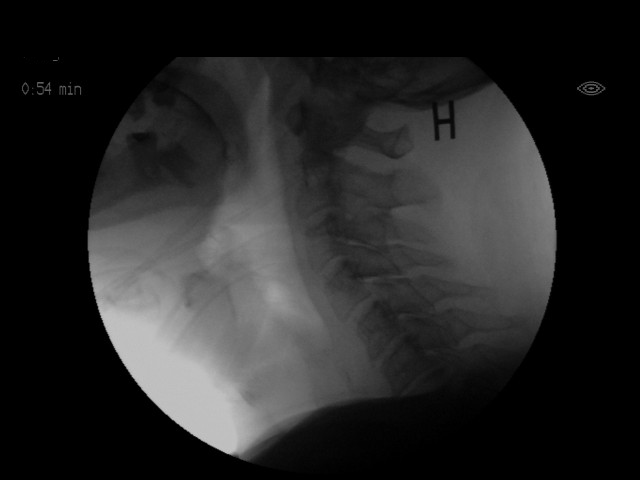
[im 10/15]
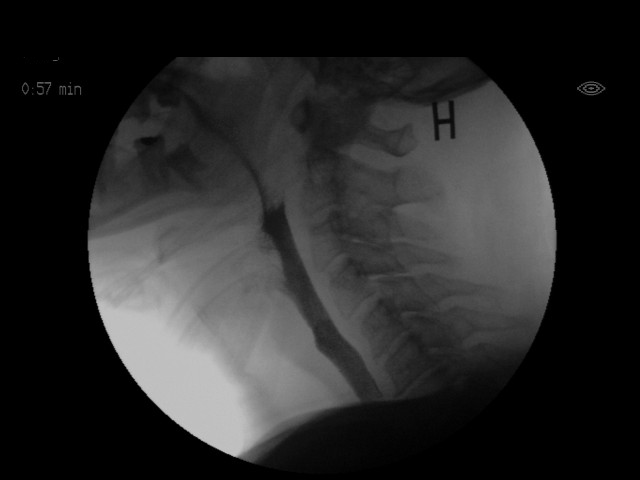
[im 11/15]
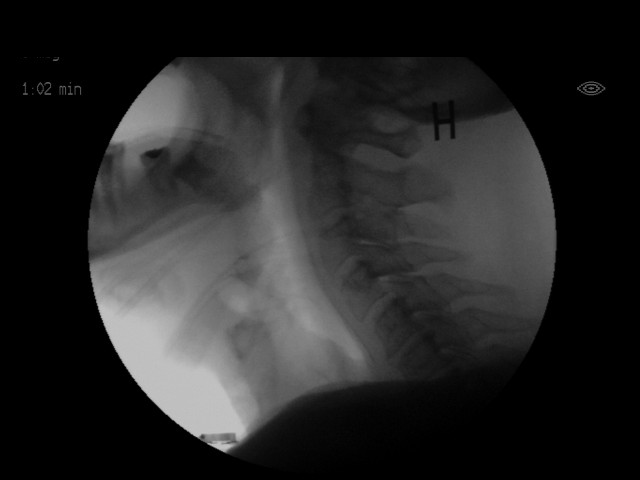
[im 12/15]
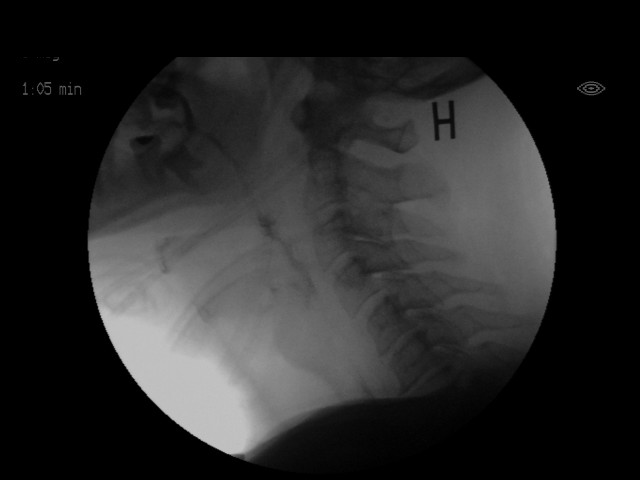
[im 13/15]
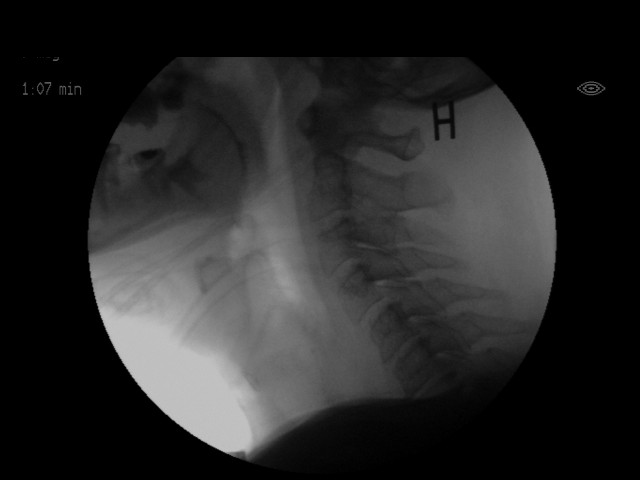
[im 13/15]
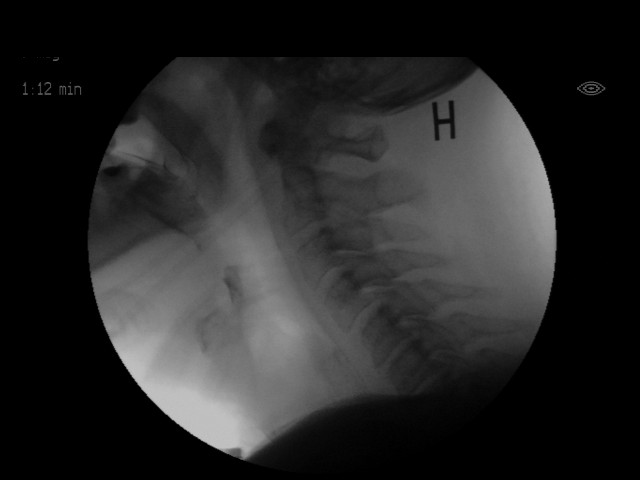
[im 15/15]
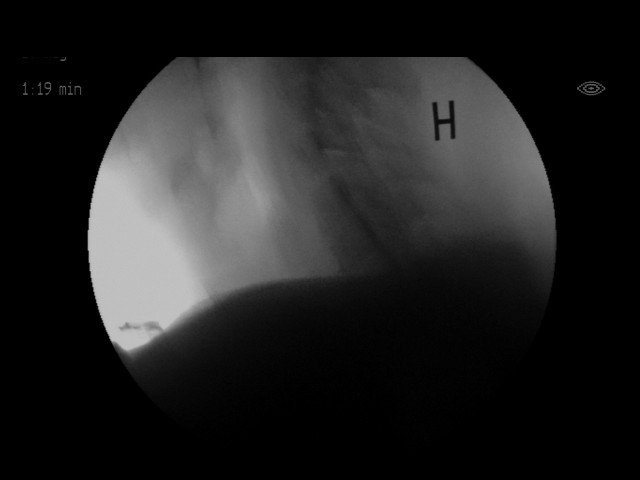
[im 15/15]
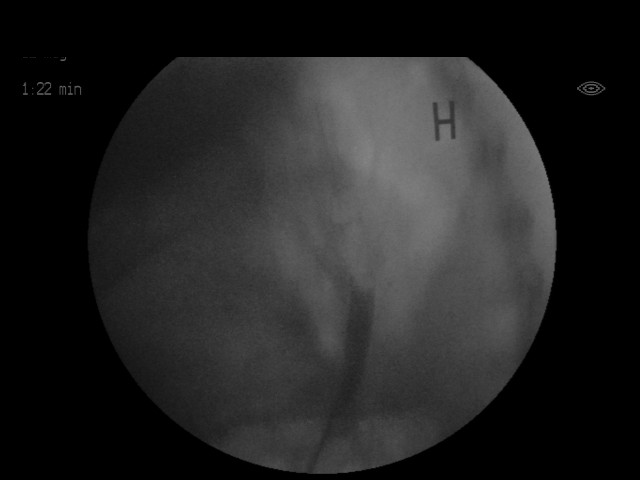

[18 of 24 positions shown; findings below may reference images not displayed]

FLUOROSCOPY FOR SWALLOWING FUNCTION STUDY:
Fluoroscopy was provided for swallowing function study, which was administered by a speech pathologist.  Final results and recommendations from this study are contained within the speech pathology report.
